# Patient Record
Sex: Female | Born: 1955 | Race: White | Hispanic: No | Marital: Married | Smoking: Never smoker
Health system: Southern US, Community
[De-identification: ages and names within clinical notes are randomized; demographics above are authoritative.]

## PROBLEM LIST (undated history)

## (undated) DIAGNOSIS — I2510 Atherosclerotic heart disease of native coronary artery without angina pectoris: Secondary | ICD-10-CM

## (undated) DIAGNOSIS — R002 Palpitations: Secondary | ICD-10-CM

## (undated) DIAGNOSIS — R7303 Prediabetes: Secondary | ICD-10-CM

## (undated) DIAGNOSIS — J45909 Unspecified asthma, uncomplicated: Secondary | ICD-10-CM

## (undated) DIAGNOSIS — G43909 Migraine, unspecified, not intractable, without status migrainosus: Secondary | ICD-10-CM

## (undated) DIAGNOSIS — E785 Hyperlipidemia, unspecified: Secondary | ICD-10-CM

## (undated) DIAGNOSIS — E039 Hypothyroidism, unspecified: Secondary | ICD-10-CM

## (undated) DIAGNOSIS — E781 Pure hyperglyceridemia: Secondary | ICD-10-CM

## (undated) DIAGNOSIS — T7840XA Allergy, unspecified, initial encounter: Secondary | ICD-10-CM

## (undated) DIAGNOSIS — I10 Essential (primary) hypertension: Secondary | ICD-10-CM

## (undated) HISTORY — DX: Essential (primary) hypertension: I10

## (undated) HISTORY — DX: Palpitations: R00.2

## (undated) HISTORY — PX: OVARIAN CYST SURGERY: SHX726

## (undated) HISTORY — DX: Prediabetes: R73.03

## (undated) HISTORY — PX: ANKLE SURGERY: SHX546

## (undated) HISTORY — PX: OTHER SURGICAL HISTORY: SHX169

## (undated) HISTORY — DX: Migraine, unspecified, not intractable, without status migrainosus: G43.909

## (undated) HISTORY — DX: Hyperlipidemia, unspecified: E78.5

## (undated) HISTORY — DX: Unspecified asthma, uncomplicated: J45.909

## (undated) HISTORY — PX: TONSILLECTOMY AND ADENOIDECTOMY: SUR1326

## (undated) HISTORY — DX: Pure hyperglyceridemia: E78.1

## (undated) HISTORY — DX: Hypothyroidism, unspecified: E03.9

## (undated) HISTORY — DX: Allergy, unspecified, initial encounter: T78.40XA

## (undated) HISTORY — DX: Atherosclerotic heart disease of native coronary artery without angina pectoris: I25.10

---

## 1984-06-02 HISTORY — PX: ABDOMINAL HYSTERECTOMY: SHX81

## 1999-07-12 ENCOUNTER — Encounter: Payer: Self-pay | Admitting: Internal Medicine

## 1999-07-12 ENCOUNTER — Ambulatory Visit (HOSPITAL_COMMUNITY): Admission: RE | Admit: 1999-07-12 | Discharge: 1999-07-12 | Payer: Self-pay | Admitting: Internal Medicine

## 2000-09-08 ENCOUNTER — Encounter: Payer: Self-pay | Admitting: Internal Medicine

## 2000-09-08 ENCOUNTER — Encounter: Admission: RE | Admit: 2000-09-08 | Discharge: 2000-09-08 | Payer: Self-pay | Admitting: Internal Medicine

## 2000-09-14 ENCOUNTER — Encounter: Payer: Self-pay | Admitting: Internal Medicine

## 2000-09-14 ENCOUNTER — Encounter: Admission: RE | Admit: 2000-09-14 | Discharge: 2000-09-14 | Payer: Self-pay

## 2001-01-05 ENCOUNTER — Encounter: Admission: RE | Admit: 2001-01-05 | Discharge: 2001-01-05 | Payer: Self-pay | Admitting: Urology

## 2001-01-05 ENCOUNTER — Encounter: Payer: Self-pay | Admitting: Urology

## 2001-04-12 ENCOUNTER — Encounter: Payer: Self-pay | Admitting: Internal Medicine

## 2001-04-12 ENCOUNTER — Encounter: Admission: RE | Admit: 2001-04-12 | Discharge: 2001-04-12 | Payer: Self-pay | Admitting: Internal Medicine

## 2001-05-06 ENCOUNTER — Emergency Department (HOSPITAL_COMMUNITY): Admission: EM | Admit: 2001-05-06 | Discharge: 2001-05-06 | Payer: Self-pay | Admitting: Emergency Medicine

## 2001-05-06 ENCOUNTER — Encounter: Payer: Self-pay | Admitting: Emergency Medicine

## 2001-11-18 ENCOUNTER — Encounter: Admission: RE | Admit: 2001-11-18 | Discharge: 2001-11-18 | Payer: Self-pay | Admitting: Internal Medicine

## 2001-11-18 ENCOUNTER — Encounter: Payer: Self-pay | Admitting: Internal Medicine

## 2002-05-05 ENCOUNTER — Ambulatory Visit (HOSPITAL_COMMUNITY): Admission: RE | Admit: 2002-05-05 | Discharge: 2002-05-05 | Payer: Self-pay | Admitting: Gastroenterology

## 2002-11-21 ENCOUNTER — Encounter: Payer: Self-pay | Admitting: Internal Medicine

## 2002-11-21 ENCOUNTER — Encounter: Admission: RE | Admit: 2002-11-21 | Discharge: 2002-11-21 | Payer: Self-pay | Admitting: Internal Medicine

## 2003-12-07 ENCOUNTER — Encounter: Admission: RE | Admit: 2003-12-07 | Discharge: 2003-12-07 | Payer: Self-pay | Admitting: Internal Medicine

## 2004-06-02 LAB — HM COLONOSCOPY

## 2004-12-19 ENCOUNTER — Encounter: Admission: RE | Admit: 2004-12-19 | Discharge: 2004-12-19 | Payer: Self-pay | Admitting: Internal Medicine

## 2004-12-30 ENCOUNTER — Encounter: Admission: RE | Admit: 2004-12-30 | Discharge: 2004-12-30 | Payer: Self-pay | Admitting: Internal Medicine

## 2005-12-22 ENCOUNTER — Encounter: Admission: RE | Admit: 2005-12-22 | Discharge: 2005-12-22 | Payer: Self-pay | Admitting: Internal Medicine

## 2006-06-02 HISTORY — PX: BACK SURGERY: SHX140

## 2006-09-03 ENCOUNTER — Encounter: Admission: RE | Admit: 2006-09-03 | Discharge: 2006-09-03 | Payer: Self-pay | Admitting: Family Medicine

## 2006-11-30 ENCOUNTER — Ambulatory Visit (HOSPITAL_COMMUNITY): Admission: RE | Admit: 2006-11-30 | Discharge: 2006-12-01 | Payer: Self-pay | Admitting: Neurosurgery

## 2006-12-21 ENCOUNTER — Other Ambulatory Visit: Admission: RE | Admit: 2006-12-21 | Discharge: 2006-12-21 | Payer: Self-pay | Admitting: *Deleted

## 2007-01-04 ENCOUNTER — Encounter: Admission: RE | Admit: 2007-01-04 | Discharge: 2007-01-04 | Payer: Self-pay | Admitting: *Deleted

## 2007-05-17 ENCOUNTER — Encounter: Admission: RE | Admit: 2007-05-17 | Discharge: 2007-05-17 | Payer: Self-pay | Admitting: Neurosurgery

## 2007-08-01 ENCOUNTER — Emergency Department (HOSPITAL_COMMUNITY): Admission: EM | Admit: 2007-08-01 | Discharge: 2007-08-01 | Payer: Self-pay | Admitting: Emergency Medicine

## 2007-08-11 ENCOUNTER — Encounter: Admission: RE | Admit: 2007-08-11 | Discharge: 2007-08-11 | Payer: Self-pay | Admitting: Family Medicine

## 2008-01-05 ENCOUNTER — Encounter: Admission: RE | Admit: 2008-01-05 | Discharge: 2008-01-05 | Payer: Self-pay | Admitting: *Deleted

## 2008-01-13 ENCOUNTER — Encounter: Admission: RE | Admit: 2008-01-13 | Discharge: 2008-01-13 | Payer: Self-pay | Admitting: *Deleted

## 2008-04-05 IMAGING — CR DG CHEST 2V
2 series · 2 of 2 positions shown · non-contrast
Comparison: none

CLINICAL DATA: HNP.  Asthma.  Hypertension.  Preadmission workup.
 CHEST- 2 VIEWS:
 No priors for comparison.

[view not recorded (1 of 2)]
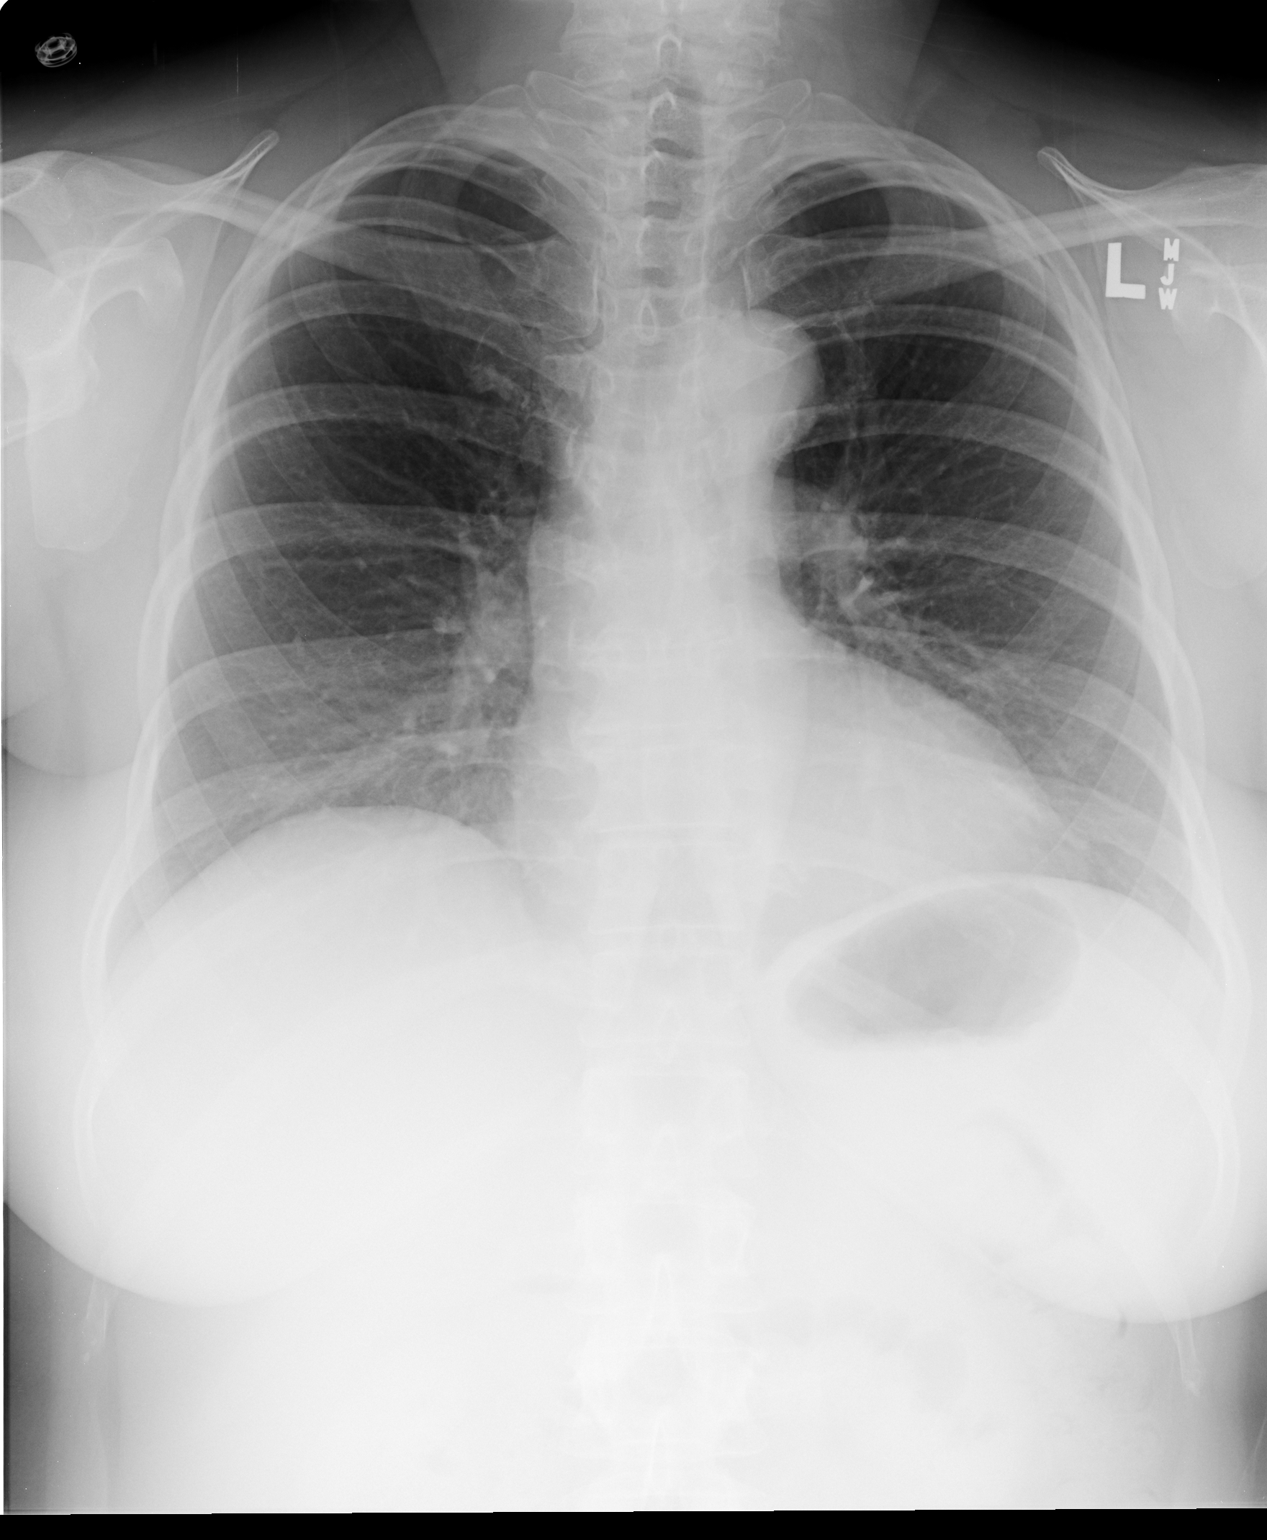

[view not recorded (2 of 2)]
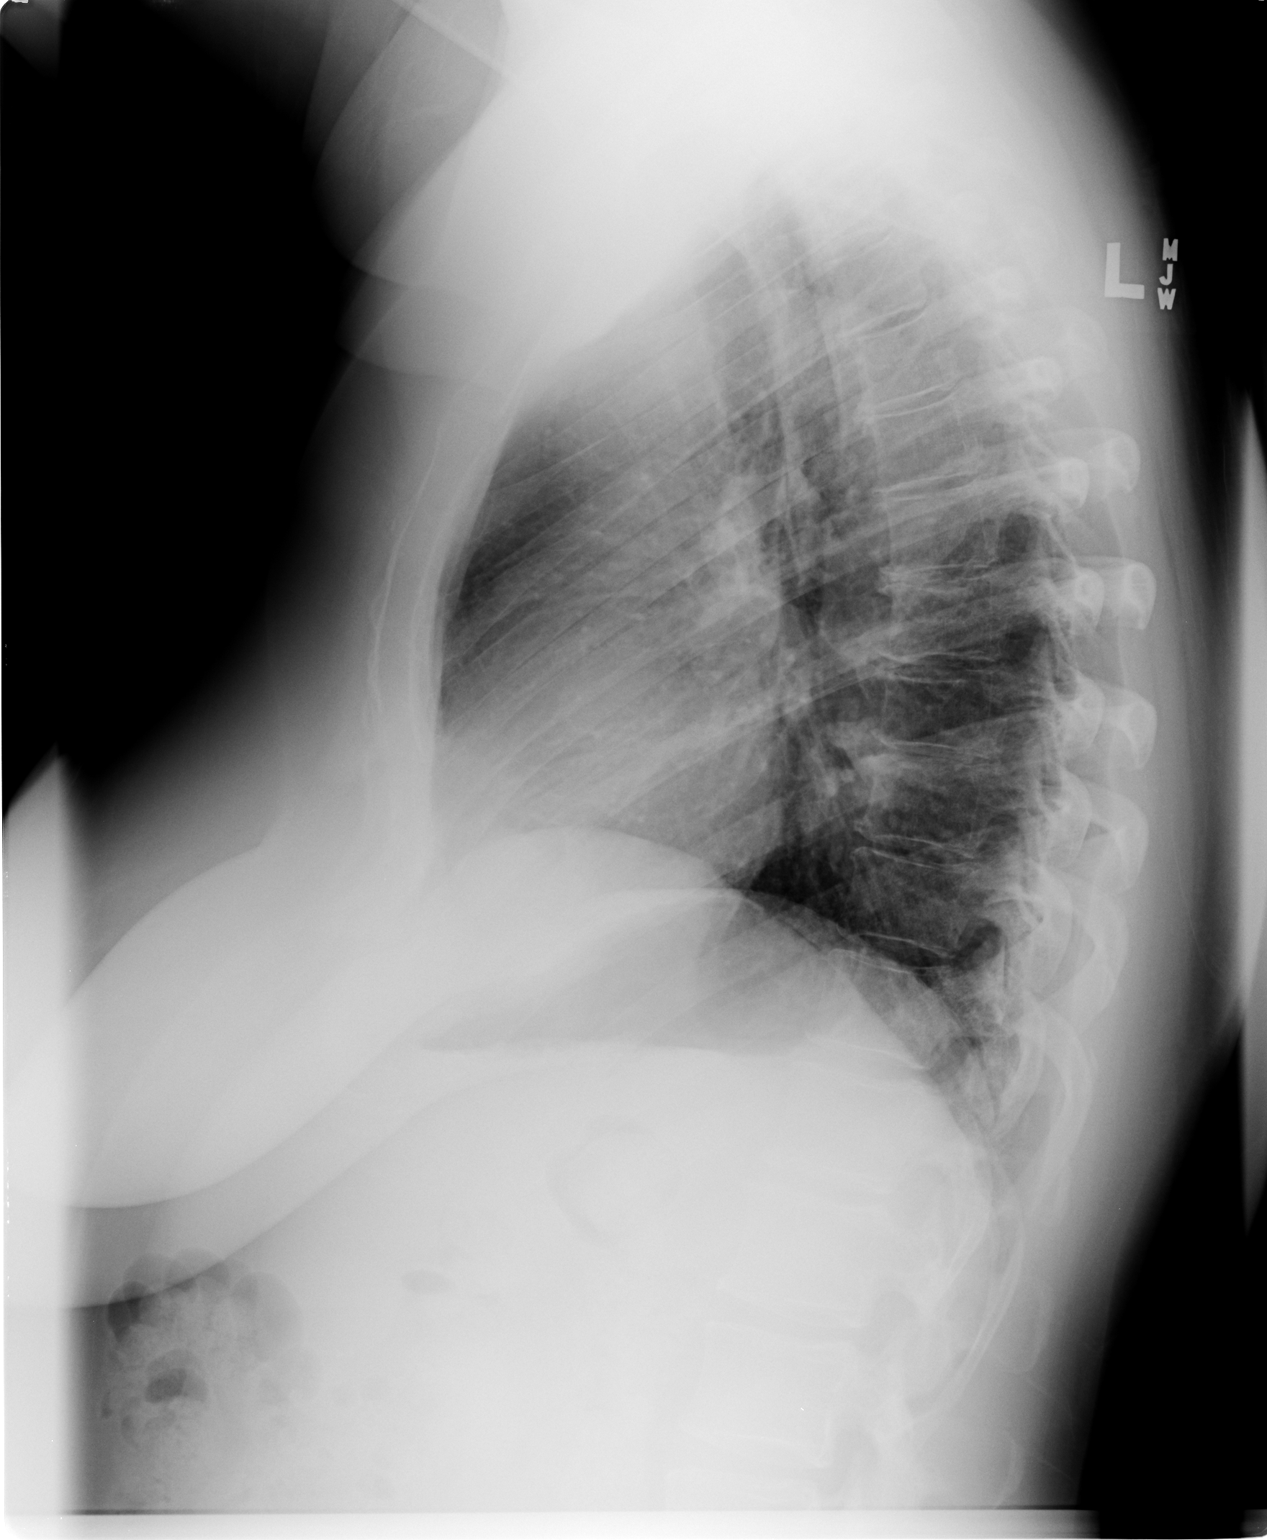

[2 of 2 positions shown; findings below may reference images not displayed]

FINDINGS: The heart size and mediastinal contours are within normal limits.  Both lungs are clear.  The visualized skeletal structures are within normal limits.
IMPRESSION: No active cardiopulmonary disease.

## 2008-07-25 ENCOUNTER — Encounter: Admission: RE | Admit: 2008-07-25 | Discharge: 2008-07-25 | Payer: Self-pay

## 2009-01-19 ENCOUNTER — Encounter: Admission: RE | Admit: 2009-01-19 | Discharge: 2009-01-19 | Payer: Self-pay | Admitting: Family Medicine

## 2009-06-02 HISTORY — PX: CARPAL TUNNEL RELEASE: SHX101

## 2010-02-20 ENCOUNTER — Encounter: Admission: RE | Admit: 2010-02-20 | Discharge: 2010-02-20 | Payer: Self-pay | Admitting: *Deleted

## 2010-10-15 NOTE — Op Note (Signed)
Tamara Rosales, BENCOSME              ACCOUNT NO.:  [id number]   MEDICAL RECORD NO.:  [id number]          PATIENT TYPE:  OIB   LOCATION:  3029                         FACILITY:  [facility]   PHYSICIAN:  Kathaleen Maser. Pool, M.D.    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   PROCEDURE NOTE:  Left L4-5 laminotomy and microdiskectomy.   SURGEON:  Kathaleen Maser. Pool, M.D.   ASSISTANT:  Reinaldo Meeker, M.D.   ANESTHESIA:  General endotracheal.   PREMEDICATION:  Ms. 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  Workup  demonstrates evidence of a left-sided L4-5 disk herniation with  compression left-sided L5 nerve root as well as superior free fragment  migrated into the axilla of the left-sided L4 nerve roots.  We discussed  risks benefits involved with surgery.  The patient wishes to proceed.   OPERATIVE NOTE:  The patient was taken to the operating room, placed on  operating table patient in supine position, adequate level anesthesia  was achieved. The patient positioned prone onto Wilson frame,  appropriately padded.  Patient's lumbar region prepped and draped  sterilely.  10 blade used to make a linear skin incision overlying the  L4-5 interspace.  This carried down sharply in the midline.  Subperiosteal dissection then performed exposing lamina and facet joints  at L4 and L5 on the left side.  Deep self-retaining retractor was  placed.  Intraoperative x-ray was taken and the level was confirmed.  The laminotomy was then performed using high speed drill and Kerrison  rongeurs to remove inferior aspect of lamina of L4, medial  aspect of L4-  5 facet joint and superior rim of the L5 lamina.  Ligamentum flavum was  elevated and resected in piecemeal fashion using Kerrison rongeurs.  The  underlying thecal sac was identified.  Microscope then brought into the  field and used for microdissection of the left-sided L4 and L5 nerve  roots.  Epidural venous plexus was coagulated and cut.  Thecal sac and  L5 nerve root were mobilized, retracted toward the midline.  Disk  herniation was readily apparent.  This was then incised with a 15 blade  in a rectangular fashion.  A wide disk space clean out was then achieved  using pituitary rongeurs, upward angled pituitary rongeurs, and Epstein  curettes.  Attention was then placed to the axilla of the left-sided L4  nerve root where a large amount of free fragment of disk herniation was  encountered and completely resected.  At this point a very thorough  decompression of both the L4 and L5 nerve roots had been achieved.  There was no evidence of injury to thecal sac or nerve roots.  There is  no evidence of loose or obviously degenerative  disk material remaining  within the interspace.  The wound was then irrigated with antibiotic  solution.  Gelfoam was placed topically for hemostasis which was found  to be good.  The microscope and retractor system were removed.  Hemostasis of muscle achieved with electrocautery.  Wound was then  closed in layers with Vicryl suture. Steri-Strips and sterile dressing  were applied.  There were no apparent complications.  The patient  tolerated the procedure well and she returned to recovery room  postoperatively.           ______________________________  Kathaleen Maser Pool, M.D.     HAP/MEDQ  D:  11/30/2006  T:  12/01/2006  Job:  [id number]

## 2010-10-18 NOTE — Op Note (Signed)
   NAME:  Tamara Rosales, Tamara Rosales                        ACCOUNT NO.:  [id number]   MEDICAL RECORD NO.:  [id number]                   PATIENT TYPE:  AMB   LOCATION:  ENDO                                 FACILITY:  [facility]   PHYSICIAN:  Danise Edge, M.D.                DATE OF BIRTH:  December 23, 1955   DATE OF PROCEDURE:  05/05/2002  DATE OF DISCHARGE:                                 OPERATIVE REPORT   PROCEDURE PERFORMED:  Colonoscopy.   REFERRING PHYSICIAN:  Marcene Duos, M.D.   ENDOSCOPIST:  Charolett Bumpers, M.D.   INDICATIONS FOR PROCEDURE:  The patient is a 55 year old female born [birth year].  The patient is undergoing diagnostic colonoscopy to evaluate  guaiac positive stool.   PREMEDICATION:  Fentanyl 75 mcg, Versed 10 mg.   INSTRUMENT USED:  Pediatric Olympus video colonoscope.   DESCRIPTION OF PROCEDURE:  After obtaining informed consent, the patient was  placed in the left lateral decubitus position.  I administered intravenous  fentanyl and intravenous Versed to achieve conscious sedation for the  procedure.  The patient's blood pressure, oxygen saturations and cardiac  rhythm were monitored throughout the procedure and documented in the medical  record.   Anal inspection was normal.  Digital rectal exam was normal.  The pediatric  Olympus video colonoscope was introduced into the rectum and advanced to the  cecum.  A normal-appearing ileocecal valve was intubated and the distal  ileum inspected.  Colonic preparation for the exam today was excellent.   Rectum:  Normal.   Sigmoid colon and descending colon:  Normal.   Splenic flexure:  Normal.   Transverse colon:  Normal.   Hepatic flexure:  Normal.   Ascending colon:  Normal.   Cecum and ileocecal valve:  Normal.   Distal ileum:  Normal.    ASSESSMENT:  Normal proctocolonoscopy to the cecum.  No endoscopic evidence  for the presence of lower gastrointestinal bleeding, colorectal neoplasia or  inflammatory bowel disease.                                                    Danise Edge, M.D.    MJ/MEDQ  D:  05/05/2002  T:  05/05/2002  Job:  [id number]

## 2011-01-20 ENCOUNTER — Other Ambulatory Visit: Payer: Self-pay | Admitting: *Deleted

## 2011-01-20 DIAGNOSIS — Z1231 Encounter for screening mammogram for malignant neoplasm of breast: Secondary | ICD-10-CM

## 2011-02-24 ENCOUNTER — Ambulatory Visit
Admission: RE | Admit: 2011-02-24 | Discharge: 2011-02-24 | Disposition: A | Discharge status: Home / Self Care | Payer: BC Managed Care – PPO | Source: Ambulatory Visit | Attending: *Deleted | Admitting: *Deleted

## 2011-02-24 ENCOUNTER — Other Ambulatory Visit: Payer: Self-pay | Admitting: Family Medicine

## 2011-02-24 DIAGNOSIS — Z1231 Encounter for screening mammogram for malignant neoplasm of breast: Secondary | ICD-10-CM

## 2011-02-24 LAB — COMPREHENSIVE METABOLIC PANEL
ALT: 30
AST: 22
Albumin: 3.6
Alkaline Phosphatase: 43
BUN: 11
CO2: 28
Calcium: 9
Chloride: 100
Creatinine, Ser: 0.78
GFR calc Af Amer: >60
GFR calc non Af Amer: >60
Glucose, Bld: 141 — ABNORMAL HIGH
Potassium: 3 — ABNORMAL LOW
Sodium: 139
Total Bilirubin: 0.7
Total Protein: 7.1

## 2011-02-24 LAB — CBC
HCT: 39.5
Hemoglobin: 13.9
MCHC: 35.2
MCV: 95.8
Platelets: 229
RBC: 4.13
RDW: 12.1
WBC: 13.5 — ABNORMAL HIGH

## 2011-02-24 LAB — DIFFERENTIAL
Basophils Absolute: 0
Basophils Relative: 0
Eosinophils Absolute: 0
Eosinophils Relative: 0
Lymphocytes Relative: 7 — ABNORMAL LOW
Lymphs Abs: 1
Monocytes Absolute: 1
Monocytes Relative: 8
Neutro Abs: 11.4 — ABNORMAL HIGH
Neutrophils Relative %: 85 — ABNORMAL HIGH

## 2011-02-24 LAB — URINALYSIS, ROUTINE W REFLEX MICROSCOPIC
Bilirubin Urine: NEGATIVE
Glucose, UA: NEGATIVE
Hgb urine dipstick: NEGATIVE
Ketones, ur: NEGATIVE
Nitrite: NEGATIVE
Protein, ur: NEGATIVE
Specific Gravity, Urine: 1.018
Urobilinogen, UA: 1
pH: 7

## 2011-02-24 LAB — INFLUENZA A+B VIRUS AG-DIRECT(RAPID)
Inflenza A Ag: NEGATIVE
Influenza B Ag: NEGATIVE

## 2011-03-19 LAB — BASIC METABOLIC PANEL
BUN: 17
CO2: 31
Calcium: 9.4
Chloride: 101
Creatinine, Ser: 0.63
GFR calc Af Amer: >60
GFR calc non Af Amer: >60
Glucose, Bld: 99
Potassium: 3.6
Sodium: 137

## 2011-03-19 LAB — DIFFERENTIAL
Basophils Absolute: 0
Basophils Relative: 1
Eosinophils Absolute: 0.1
Eosinophils Relative: 3
Lymphocytes Relative: 51 — ABNORMAL HIGH
Lymphs Abs: 2.3
Monocytes Absolute: 0.5
Monocytes Relative: 11
Neutro Abs: 1.5 — ABNORMAL LOW
Neutrophils Relative %: 34 — ABNORMAL LOW

## 2011-03-19 LAB — CBC
HCT: 39.5
Hemoglobin: 14.1
MCHC: 35.7
MCV: 93.5
Platelets: 283
RBC: 4.23
RDW: 12.6
WBC: 4.5

## 2011-03-19 LAB — TYPE AND SCREEN
ABO/RH(D): O POS
Antibody Screen: NEGATIVE

## 2011-03-19 LAB — ABO/RH: ABO/RH(D): O POS

## 2012-03-09 ENCOUNTER — Other Ambulatory Visit: Payer: Self-pay | Admitting: Family Medicine

## 2012-03-09 DIAGNOSIS — Z1231 Encounter for screening mammogram for malignant neoplasm of breast: Secondary | ICD-10-CM

## 2012-04-06 ENCOUNTER — Ambulatory Visit
Admission: RE | Admit: 2012-04-06 | Discharge: 2012-04-06 | Disposition: A | Discharge status: Home / Self Care | Payer: BC Managed Care – PPO | Source: Ambulatory Visit | Attending: Family Medicine | Admitting: Family Medicine

## 2012-04-06 DIAGNOSIS — Z1231 Encounter for screening mammogram for malignant neoplasm of breast: Secondary | ICD-10-CM

## 2012-04-06 LAB — HM MAMMOGRAPHY: HM Mammogram: NEGATIVE

## 2012-09-09 ENCOUNTER — Encounter: Payer: Self-pay | Admitting: *Deleted

## 2012-09-15 ENCOUNTER — Encounter: Payer: Self-pay | Admitting: *Deleted

## 2012-09-15 ENCOUNTER — Other Ambulatory Visit: Payer: Self-pay | Admitting: Neurosurgery

## 2012-09-15 DIAGNOSIS — M792 Neuralgia and neuritis, unspecified: Secondary | ICD-10-CM

## 2012-09-24 ENCOUNTER — Other Ambulatory Visit: Payer: BC Managed Care – PPO

## 2012-09-27 ENCOUNTER — Ambulatory Visit
Admission: RE | Admit: 2012-09-27 | Discharge: 2012-09-27 | Disposition: A | Discharge status: Home / Self Care | Payer: BC Managed Care – PPO | Source: Ambulatory Visit | Attending: Neurosurgery | Admitting: Neurosurgery

## 2012-09-27 DIAGNOSIS — M792 Neuralgia and neuritis, unspecified: Secondary | ICD-10-CM

## 2012-09-27 MED ORDER — GADOBENATE DIMEGLUMINE 529 MG/ML IV SOLN
17.0000 mL | Freq: Once | INTRAVENOUS | Status: AC | PRN
  Administered 2012-09-27: 17 mL via INTRAVENOUS

## 2012-10-06 ENCOUNTER — Ambulatory Visit (INDEPENDENT_AMBULATORY_CARE_PROVIDER_SITE_OTHER): Payer: BC Managed Care – PPO | Admitting: Family Medicine

## 2012-10-06 ENCOUNTER — Encounter: Payer: Self-pay | Admitting: Family Medicine

## 2012-10-06 VITALS — BP 126/90 | HR 76 | Ht 64.0 in | Wt 203.0 lb

## 2012-10-06 DIAGNOSIS — R5383 Other fatigue: Secondary | ICD-10-CM

## 2012-10-06 DIAGNOSIS — I10 Essential (primary) hypertension: Secondary | ICD-10-CM | POA: Insufficient documentation

## 2012-10-06 DIAGNOSIS — J45901 Unspecified asthma with (acute) exacerbation: Secondary | ICD-10-CM

## 2012-10-06 DIAGNOSIS — E039 Hypothyroidism, unspecified: Secondary | ICD-10-CM

## 2012-10-06 DIAGNOSIS — J309 Allergic rhinitis, unspecified: Secondary | ICD-10-CM

## 2012-10-06 DIAGNOSIS — R5381 Other malaise: Secondary | ICD-10-CM

## 2012-10-06 DIAGNOSIS — Z0000 Encounter for general adult medical examination without abnormal findings: Secondary | ICD-10-CM

## 2012-10-06 DIAGNOSIS — N959 Unspecified menopausal and perimenopausal disorder: Secondary | ICD-10-CM | POA: Insufficient documentation

## 2012-10-06 LAB — CBC WITH DIFFERENTIAL/PLATELET
Basophils Absolute: 0 10*3/uL (ref 0.0–0.1)
Basophils Relative: 0 % (ref 0–1)
Eosinophils Absolute: 0.2 10*3/uL (ref 0.0–0.7)
Eosinophils Relative: 2 % (ref 0–5)
HCT: 37.8 % (ref 36.0–46.0)
Hemoglobin: 13.2 g/dL (ref 12.0–15.0)
Lymphocytes Relative: 43 % (ref 12–46)
Lymphs Abs: 3.1 10*3/uL (ref 0.7–4.0)
MCH: 32.5 pg (ref 26.0–34.0)
MCHC: 34.9 g/dL (ref 30.0–36.0)
MCV: 93.1 fL (ref 78.0–100.0)
Monocytes Absolute: 0.6 10*3/uL (ref 0.1–1.0)
Monocytes Relative: 8 % (ref 3–12)
Neutro Abs: 3.3 10*3/uL (ref 1.7–7.7)
Neutrophils Relative %: 47 % (ref 43–77)
Platelets: 267 10*3/uL (ref 150–400)
RBC: 4.06 MIL/uL (ref 3.87–5.11)
RDW: 13.5 % (ref 11.5–15.5)
WBC: 7.2 10*3/uL (ref 4.0–10.5)

## 2012-10-06 LAB — POCT URINALYSIS DIPSTICK
Bilirubin, UA: NEGATIVE
Blood, UA: NEGATIVE
Glucose, UA: NEGATIVE
Ketones, UA: NEGATIVE
Leukocytes, UA: NEGATIVE
Nitrite, UA: NEGATIVE
Protein, UA: NEGATIVE
Spec Grav, UA: <=1.005
Urobilinogen, UA: NEGATIVE
pH, UA: 5

## 2012-10-06 LAB — COMPREHENSIVE METABOLIC PANEL
ALT: 27 U/L (ref 0–35)
AST: 23 U/L (ref 0–37)
Albumin: 4.3 g/dL (ref 3.5–5.2)
Alkaline Phosphatase: 39 U/L (ref 39–117)
BUN: 16 mg/dL (ref 6–23)
CO2: 31 mEq/L (ref 19–32)
Calcium: 9.7 mg/dL (ref 8.4–10.5)
Chloride: 101 mEq/L (ref 96–112)
Creat: 0.71 mg/dL (ref 0.50–1.10)
Glucose, Bld: 80 mg/dL (ref 70–99)
Potassium: 3.5 mEq/L (ref 3.5–5.3)
Sodium: 141 mEq/L (ref 135–145)
Total Bilirubin: 0.5 mg/dL (ref 0.3–1.2)
Total Protein: 7.1 g/dL (ref 6.0–8.3)

## 2012-10-06 LAB — LIPID PANEL
Cholesterol: 168 mg/dL (ref 0–200)
HDL: 45 mg/dL (ref >39)
LDL Cholesterol: 89 mg/dL (ref 0–99)
Total CHOL/HDL Ratio: 3.7 Ratio
Triglycerides: 171 mg/dL — ABNORMAL HIGH (ref <150)
VLDL: 34 mg/dL (ref 0–40)

## 2012-10-06 LAB — TSH: TSH: 1.154 u[IU]/mL (ref 0.350–4.500)

## 2012-10-06 MED ORDER — ESTROGENS, CONJUGATED 0.625 MG/GM VA CREA: TOPICAL_CREAM | VAGINAL | Status: DC

## 2012-10-06 MED ORDER — LISINOPRIL-HYDROCHLOROTHIAZIDE 20-12.5 MG PO TABS: 2.0000 | ORAL_TABLET | Freq: Every day | ORAL | Status: DC
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
## 2012-10-06 NOTE — Patient Instructions (Addendum)

## 2012-10-07 ENCOUNTER — Encounter: Payer: Self-pay | Admitting: Family Medicine

## 2012-10-07 LAB — VITAMIN D 25 HYDROXY (VIT D DEFICIENCY, FRACTURES): Vit D, 25-Hydroxy: 45 ng/mL (ref 30–89)

## 2012-10-07 MED ORDER — SYNTHROID 125 MCG PO TABS: 125.0000 ug | ORAL_TABLET | Freq: Every day | ORAL | Status: DC

## 2012-10-07 NOTE — Addendum Note (Signed)
Addended by: Joselyn Arrow on: 10/07/2012 08:40 AM   Modules accepted: Orders

## 2013-03-04 ENCOUNTER — Other Ambulatory Visit (INDEPENDENT_AMBULATORY_CARE_PROVIDER_SITE_OTHER): Payer: BC Managed Care – PPO

## 2013-03-04 DIAGNOSIS — Z23 Encounter for immunization: Secondary | ICD-10-CM

## 2013-03-10 ENCOUNTER — Other Ambulatory Visit: Payer: Self-pay

## 2013-03-10 DIAGNOSIS — Z1231 Encounter for screening mammogram for malignant neoplasm of breast: Secondary | ICD-10-CM

## 2013-04-05 ENCOUNTER — Other Ambulatory Visit: Payer: Self-pay | Admitting: Family Medicine

## 2013-04-27 ENCOUNTER — Ambulatory Visit
Admission: RE | Admit: 2013-04-27 | Discharge: 2013-04-27 | Disposition: A | Discharge status: Home / Self Care | Payer: BC Managed Care – PPO | Source: Ambulatory Visit

## 2013-04-27 ENCOUNTER — Encounter: Payer: BC Managed Care – PPO | Admitting: Family Medicine

## 2013-04-27 DIAGNOSIS — Z1231 Encounter for screening mammogram for malignant neoplasm of breast: Secondary | ICD-10-CM

## 2013-06-20 ENCOUNTER — Encounter: Payer: BC Managed Care – PPO | Admitting: Family Medicine

## 2013-07-13 ENCOUNTER — Encounter: Payer: Self-pay | Admitting: Family Medicine

## 2013-07-13 ENCOUNTER — Ambulatory Visit (INDEPENDENT_AMBULATORY_CARE_PROVIDER_SITE_OTHER): Payer: BC Managed Care – PPO | Admitting: Family Medicine

## 2013-07-13 VITALS — BP 120/78 | HR 76 | Ht 64.0 in | Wt 192.0 lb

## 2013-07-13 DIAGNOSIS — E039 Hypothyroidism, unspecified: Secondary | ICD-10-CM

## 2013-07-13 DIAGNOSIS — J45909 Unspecified asthma, uncomplicated: Secondary | ICD-10-CM

## 2013-07-13 DIAGNOSIS — R5383 Other fatigue: Secondary | ICD-10-CM

## 2013-07-13 DIAGNOSIS — I10 Essential (primary) hypertension: Secondary | ICD-10-CM

## 2013-07-13 DIAGNOSIS — J309 Allergic rhinitis, unspecified: Secondary | ICD-10-CM

## 2013-07-13 DIAGNOSIS — J45998 Other asthma: Secondary | ICD-10-CM

## 2013-07-13 DIAGNOSIS — R5381 Other malaise: Secondary | ICD-10-CM

## 2013-07-13 MED ORDER — LISINOPRIL-HYDROCHLOROTHIAZIDE 20-12.5 MG PO TABS: ORAL_TABLET | ORAL | Status: DC

## 2013-07-13 NOTE — Patient Instructions (Signed)
Continue your current medications. Try using exercise for stress reduction. Continue Weight Watchers and weight loss.  We will contact you with your results, and send in Synthroid refill after your labs are reviewed.
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
## 2013-07-14 LAB — TSH: TSH: 1.575 u[IU]/mL (ref 0.350–4.500)

## 2013-07-14 MED ORDER — SYNTHROID 125 MCG PO TABS: ORAL_TABLET | ORAL | Status: DC

## 2013-11-04 ENCOUNTER — Telehealth: Payer: Self-pay

## 2013-11-04 NOTE — Telephone Encounter (Signed)
Pt states she has never tried anything in the past and that she will not be on a boat or any water she will be traveling by bus and would need something for on the way there and on the way back. She states she would like to discuss this with you further on Monday.

## 2013-11-04 NOTE — Telephone Encounter (Signed)
Pt states she is going on a mission trip on 11[place]27/13 and would like to know if you can call her something in to prevent motion sickness

## 2013-11-04 NOTE — Telephone Encounter (Signed)
Has she tried anything in the past? Usually OTC Dramamine is effective for motion sickness.  There are prescription patches that are usually used for more severe, longterm cases (ie cruises).   Side effects of both meds include dry mouth, sedation, constipation.  The patch is changed every 3 days--if she only needs for travel days, doesn't necessarily make sense to use the patch. If she will be on a boat, frequent buses with daily activities that might make her sick, then the patch might be okay.  I need to know how long she will be gone to know how many patches to prescribe, if she decides to go the rx route rather than dramamine (there are "less drowsy" forms of dramamine, which is actually meclizine, also used for vertigo, that can also be very effective, although still slightly sedating). If she has more questions, we can call her back Monday when I'm in the office (vs offering OV to discuss options)

## 2013-11-04 NOTE — Telephone Encounter (Signed)
It doesn't appear that she scheduled OV (which would be recommended when there are openings and she is asking for a new prescription medication).  Please call her Monday morning, and recommend that she try the OTC Meclizine--she can try it out now and see how she feels with it.  Vs scheduling appt to discuss prescription options

## 2013-11-07 NOTE — Telephone Encounter (Signed)
Called patient and left message letting her know that Dr.Knapp is recommending that she try OTC Meclizine and she how she feels with it. If she would rather come in for OV to discuss the rx options that is okay to-just needs to call me back to schedule something.

## 2013-11-16 ENCOUNTER — Encounter: Payer: BC Managed Care – PPO | Admitting: Family Medicine

## 2013-12-27 ENCOUNTER — Telehealth: Payer: Self-pay | Admitting: Family Medicine

## 2013-12-27 NOTE — Telephone Encounter (Signed)
L/m for pt to call. Needs to reschedule cpe on 08/17

## 2014-01-03 ENCOUNTER — Encounter: Payer: Self-pay | Admitting: Family Medicine

## 2014-01-06 ENCOUNTER — Telehealth: Payer: Self-pay | Admitting: Family Medicine

## 2014-01-06 NOTE — Telephone Encounter (Signed)
Patient received message to reschedule 8/17 CPE, next availble CPE is 03/30/14. Patient has forms for DMV that she has to have completed and returned to [facility] for her knee brace and can not wait that long. Is there any way we can leave her on 01/16/14 and give her extra time or something

## 2014-01-06 NOTE — Telephone Encounter (Signed)
She is due for med check (labs were 09/2012)--I would prefer that visit be changed to a med check (fasting), and r/s the CPE--that is assuming she isn't on Medicare.  I can't see what her insurance is, but she is only 59. Her last OV was 07/2013 and she will be needing refills on BP and thyroid meds, so a med check would be appropriate (and can be done on 8/17). I can look at her DMV forms at that visit--I'm not aware of her having a knee brace or filling out DMV forms in the past for her (nothing in media, no mention in her first and only visit here last year).  She can r/s the CPE (no particular rush, so ok for [employer] would end up being charged for OV as well as CPE if being done in August,whereas if she has no other issues in October, will only be CPE then).  If this turns out to be a big issue (it shouldn't be), then okay to keep as is, but need to limit other med checks that morning.

## 2014-01-16 ENCOUNTER — Ambulatory Visit (INDEPENDENT_AMBULATORY_CARE_PROVIDER_SITE_OTHER): Payer: BC Managed Care – PPO | Admitting: Family Medicine

## 2014-01-16 ENCOUNTER — Encounter: Payer: Self-pay | Admitting: Family Medicine

## 2014-01-16 VITALS — BP 126/80 | HR 68 | Ht 64.0 in | Wt 201.0 lb

## 2014-01-16 DIAGNOSIS — I10 Essential (primary) hypertension: Secondary | ICD-10-CM

## 2014-01-16 DIAGNOSIS — E039 Hypothyroidism, unspecified: Secondary | ICD-10-CM

## 2014-01-16 DIAGNOSIS — J45901 Unspecified asthma with (acute) exacerbation: Secondary | ICD-10-CM

## 2014-01-16 DIAGNOSIS — E781 Pure hyperglyceridemia: Secondary | ICD-10-CM

## 2014-01-16 MED ORDER — LISINOPRIL-HYDROCHLOROTHIAZIDE 20-12.5 MG PO TABS: ORAL_TABLET | ORAL | Status: DC

## 2014-01-16 MED ORDER — ALBUTEROL SULFATE 0.63 MG/3ML IN NEBU: 1.0000 | INHALATION_SOLUTION | Freq: Four times a day (QID) | RESPIRATORY_TRACT | Status: DC | PRN

## 2014-01-16 MED ORDER — ALBUTEROL SULFATE HFA 108 (90 BASE) MCG/ACT IN AERS: 2.0000 | INHALATION_SPRAY | Freq: Four times a day (QID) | RESPIRATORY_TRACT | Status: DC | PRN

## 2014-01-16 NOTE — Patient Instructions (Signed)
Restart your walking routine, and try and lose weight. Return for fasting labs as soon as you can.
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
## 2014-03-28 ENCOUNTER — Encounter: Payer: Self-pay | Admitting: Internal Medicine

## 2014-03-29 ENCOUNTER — Other Ambulatory Visit: Payer: Self-pay

## 2014-03-29 DIAGNOSIS — Z1231 Encounter for screening mammogram for malignant neoplasm of breast: Secondary | ICD-10-CM

## 2014-03-30 ENCOUNTER — Encounter: Payer: BC Managed Care – PPO | Admitting: Family Medicine

## 2014-04-03 ENCOUNTER — Encounter: Payer: Self-pay | Admitting: Family Medicine

## 2014-05-01 ENCOUNTER — Ambulatory Visit
Admission: RE | Admit: 2014-05-01 | Discharge: 2014-05-01 | Disposition: A | Discharge status: Home / Self Care | Payer: BC Managed Care – PPO | Source: Ambulatory Visit

## 2014-05-01 ENCOUNTER — Telehealth: Payer: Self-pay | Admitting: Internal Medicine

## 2014-05-01 DIAGNOSIS — Z1231 Encounter for screening mammogram for malignant neoplasm of breast: Secondary | ICD-10-CM

## 2014-05-01 NOTE — Telephone Encounter (Signed)
Pt called stating that she needs to come in 3 different days and get her bp checked. After she gets all 3 of her bp done, she has to have a letter of what her bp is. Can she just schedule nurse visits to come in or do ou need to see her

## 2014-05-01 NOTE — Telephone Encounter (Signed)
What is this for? Is it for DMV? Per last visit, BP's have all been normal.  If I need to write a letter, needs OV.  If she just needs the three values written (and they are all normal), and signed by me, we can do that without a visit--can be nurse visits.  So, truly it will depend on whether the BP's are normal or not, and if documentation can be a simple note, rather than a formal letter.  It would help to know what this is for.

## 2014-05-02 LAB — HM MAMMOGRAPHY

## 2014-05-02 NOTE — Telephone Encounter (Signed)
Pt states that it is through her work but from the [facility]. She wears a leg brace and they have her in this program and she is trying to get out of it so she has to have these bp readings. She states that it should only be a simple note stating what the 3 bp's are and then signed by you. She is suppose to have it done by the 10th but she is going to call and see if she can get an extension on it and see if she can come in during her christmas break when she is off work so she doesn't have to take days off to come in and she will get back to [place].

## 2014-05-02 NOTE — Telephone Encounter (Signed)
As long as it is just a signed statement/note of what her 3 BP readings are, it can be nurse visit

## 2014-06-28 ENCOUNTER — Ambulatory Visit (INDEPENDENT_AMBULATORY_CARE_PROVIDER_SITE_OTHER): Payer: BC Managed Care – PPO | Admitting: Family Medicine

## 2014-06-28 ENCOUNTER — Encounter: Payer: Self-pay | Admitting: Family Medicine

## 2014-06-28 VITALS — BP 114/86 | HR 60 | Ht 64.0 in | Wt 200.0 lb

## 2014-06-28 DIAGNOSIS — Z0000 Encounter for general adult medical examination without abnormal findings: Secondary | ICD-10-CM

## 2014-06-28 DIAGNOSIS — Z23 Encounter for immunization: Secondary | ICD-10-CM

## 2014-06-28 DIAGNOSIS — J45901 Unspecified asthma with (acute) exacerbation: Secondary | ICD-10-CM

## 2014-06-28 DIAGNOSIS — E781 Pure hyperglyceridemia: Secondary | ICD-10-CM

## 2014-06-28 DIAGNOSIS — R05 Cough: Secondary | ICD-10-CM

## 2014-06-28 DIAGNOSIS — E039 Hypothyroidism, unspecified: Secondary | ICD-10-CM

## 2014-06-28 DIAGNOSIS — N959 Unspecified menopausal and perimenopausal disorder: Secondary | ICD-10-CM

## 2014-06-28 DIAGNOSIS — R059 Cough, unspecified: Secondary | ICD-10-CM

## 2014-06-28 DIAGNOSIS — I10 Essential (primary) hypertension: Secondary | ICD-10-CM

## 2014-06-28 LAB — COMPREHENSIVE METABOLIC PANEL
ALT: 22 U/L (ref 0–35)
AST: 19 U/L (ref 0–37)
Albumin: 4.1 g/dL (ref 3.5–5.2)
Alkaline Phosphatase: 44 U/L (ref 39–117)
BUN: 18 mg/dL (ref 6–23)
CO2: 30 mEq/L (ref 19–32)
Calcium: 9.5 mg/dL (ref 8.4–10.5)
Chloride: 96 mEq/L (ref 96–112)
Creat: 0.68 mg/dL (ref 0.50–1.10)
Glucose, Bld: 85 mg/dL (ref 70–99)
Potassium: 4 mEq/L (ref 3.5–5.3)
Sodium: 135 mEq/L (ref 135–145)
Total Bilirubin: 0.5 mg/dL (ref 0.2–1.2)
Total Protein: 7 g/dL (ref 6.0–8.3)

## 2014-06-28 LAB — POCT URINALYSIS DIPSTICK
Bilirubin, UA: NEGATIVE
Blood, UA: NEGATIVE
Glucose, UA: NEGATIVE
Ketones, UA: NEGATIVE
Leukocytes, UA: NEGATIVE
Nitrite, UA: NEGATIVE
Protein, UA: NEGATIVE
Spec Grav, UA: 1.025
Urobilinogen, UA: NEGATIVE
pH, UA: 6

## 2014-06-28 LAB — CBC WITH DIFFERENTIAL/PLATELET
Basophils Absolute: 0 10*3/uL (ref 0.0–0.1)
Basophils Relative: 0 % (ref 0–1)
Eosinophils Absolute: 0.2 10*3/uL (ref 0.0–0.7)
Eosinophils Relative: 2 % (ref 0–5)
HCT: 38.8 % (ref 36.0–46.0)
Hemoglobin: 13.1 g/dL (ref 12.0–15.0)
Lymphocytes Relative: 35 % (ref 12–46)
Lymphs Abs: 3.4 10*3/uL (ref 0.7–4.0)
MCH: 32.6 pg (ref 26.0–34.0)
MCHC: 33.8 g/dL (ref 30.0–36.0)
MCV: 96.5 fL (ref 78.0–100.0)
MPV: 9.1 fL (ref 8.6–12.4)
Monocytes Absolute: 0.8 10*3/uL (ref 0.1–1.0)
Monocytes Relative: 8 % (ref 3–12)
Neutro Abs: 5.3 10*3/uL (ref 1.7–7.7)
Neutrophils Relative %: 55 % (ref 43–77)
Platelets: 275 10*3/uL (ref 150–400)
RBC: 4.02 MIL/uL (ref 3.87–5.11)
RDW: 12.9 % (ref 11.5–15.5)
WBC: 9.6 10*3/uL (ref 4.0–10.5)

## 2014-06-28 LAB — TSH: TSH: 2.347 u[IU]/mL (ref 0.350–4.500)

## 2014-06-28 LAB — LIPID PANEL
Cholesterol: 168 mg/dL (ref 0–200)
HDL: 46 mg/dL (ref >39)
LDL Cholesterol: 101 mg/dL — ABNORMAL HIGH (ref 0–99)
Total CHOL/HDL Ratio: 3.7 Ratio
Triglycerides: 103 mg/dL (ref <150)
VLDL: 21 mg/dL (ref 0–40)

## 2014-06-28 MED ORDER — LISINOPRIL-HYDROCHLOROTHIAZIDE 20-12.5 MG PO TABS: ORAL_TABLET | ORAL | Status: DC

## 2014-06-28 MED ORDER — ALBUTEROL SULFATE 0.63 MG/3ML IN NEBU: 1.0000 | INHALATION_SOLUTION | Freq: Four times a day (QID) | RESPIRATORY_TRACT | Status: DC | PRN

## 2014-06-28 MED ORDER — ALBUTEROL SULFATE HFA 108 (90 BASE) MCG/ACT IN AERS: 2.0000 | INHALATION_SPRAY | Freq: Four times a day (QID) | RESPIRATORY_TRACT | Status: DC | PRN

## 2014-06-28 MED ORDER — ESTROGENS, CONJUGATED 0.625 MG/GM VA CREA: TOPICAL_CREAM | VAGINAL | Status: DC
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
## 2014-06-28 NOTE — Patient Instructions (Addendum)
  HEALTH MAINTENANCE RECOMMENDATIONS:  It is recommended that you get at least 30 minutes of aerobic exercise at least 5 days/week (for weight loss, you may need as much as 60-90 minutes). This can be any activity that gets your heart rate up. This can be divided in 10-15 minute intervals if needed, but try and build up your endurance at least once a week.  Weight bearing exercise is also recommended twice weekly.  Eat a healthy diet with lots of vegetables, fruits and fiber.  "Colorful" foods have a lot of vitamins (ie green vegetables, tomatoes, red peppers, etc).  Limit sweet tea, regular sodas and alcoholic beverages, all of which has a lot of calories and sugar.  Up to 1 alcoholic drink daily may be beneficial for women (unless trying to lose weight, watch sugars).  Drink a lot of water.  Calcium recommendations are 1200-1500 mg daily (1500 mg for postmenopausal women or women without ovaries), and vitamin D 1000 IU daily.  This should be obtained from diet and/or supplements (vitamins), and calcium should not be taken all at once, but in divided doses.  Monthly self breast exams and yearly mammograms for women over the age of 37 is recommended.  Sunscreen of at least SPF 30 should be used on all sun-exposed parts of the skin when outside between the hours of 10 am and 4 pm (not just when at beach or pool, but even with exercise, golf, tennis, and yard work!)  Use a sunscreen that says "broad spectrum" so it covers both UVA and UVB rays, and make sure to reapply every 1-2 hours.  Remember to change the batteries in your smoke detectors when changing your clock times in the spring and fall.  Use your seat belt every time you are in a car, and please drive safely and not be distracted with cell phones and texting while driving.  Start taking plain Mucinex during the day, continue the DM version at bedtime. Continue to drink plenty of fluid. Use saline spray (you have some bleeding noted in the  nose). Use Coricidin to help with nasal congestion and drainage (which contributes to the cough).  Restart the premarin vaginal cream 3 times per week.  You may eventually (in 2-4 weeks, or longer, if needed) cut back to just 1-2 times/week, if that frequency is enough to control vaginal dryness.  If you need to continue 3x/week longterm, that is fine.

## 2014-06-29 ENCOUNTER — Encounter: Payer: Self-pay | Admitting: Family Medicine

## 2014-06-29 MED ORDER — SYNTHROID 125 MCG PO TABS: ORAL_TABLET | ORAL | Status: DC

## 2014-06-29 NOTE — Addendum Note (Signed)
Addended by: Rita Ohara on: 06/29/2014 08:38 AM   Modules accepted: Orders

## 2014-07-31 ENCOUNTER — Other Ambulatory Visit: Payer: BC Managed Care – PPO

## 2014-09-04 ENCOUNTER — Telehealth: Payer: Self-pay | Admitting: Internal Medicine

## 2014-09-04 MED ORDER — ESTRADIOL 2 MG VA RING: 2.0000 mg | VAGINAL_RING | VAGINAL | Status: DC

## 2014-09-04 NOTE — Telephone Encounter (Signed)
Pt would like to try the E-string for her estrogen and would like it called into [place]

## 2014-09-04 NOTE — Telephone Encounter (Signed)
Patient advised.

## 2014-09-04 NOTE — Telephone Encounter (Signed)
Advise pt that estring was sent to her pharmacy.  I'm sure she knows this, but she is to stop the premarin cream (likely she already did that--didn't like it, and is asking to switch back to the estring, which she previously took).

## 2014-09-18 ENCOUNTER — Other Ambulatory Visit: Payer: BC Managed Care – PPO

## 2014-09-19 ENCOUNTER — Other Ambulatory Visit: Payer: BC Managed Care – PPO

## 2014-09-20 ENCOUNTER — Other Ambulatory Visit: Payer: BC Managed Care – PPO

## 2014-09-20 ENCOUNTER — Ambulatory Visit: Payer: BC Managed Care – PPO | Admitting: Medical

## 2014-12-21 ENCOUNTER — Ambulatory Visit (INDEPENDENT_AMBULATORY_CARE_PROVIDER_SITE_OTHER): Payer: BC Managed Care – PPO | Admitting: Family Medicine

## 2014-12-21 ENCOUNTER — Encounter: Payer: Self-pay | Admitting: Family Medicine

## 2014-12-21 VITALS — BP 112/72 | HR 80 | Ht 64.0 in | Wt 191.0 lb

## 2014-12-21 DIAGNOSIS — E669 Obesity, unspecified: Secondary | ICD-10-CM | POA: Diagnosis not present

## 2014-12-21 DIAGNOSIS — E039 Hypothyroidism, unspecified: Secondary | ICD-10-CM | POA: Diagnosis not present

## 2014-12-21 DIAGNOSIS — J309 Allergic rhinitis, unspecified: Secondary | ICD-10-CM | POA: Diagnosis not present

## 2014-12-21 DIAGNOSIS — J45998 Other asthma: Secondary | ICD-10-CM | POA: Diagnosis not present

## 2014-12-21 DIAGNOSIS — I10 Essential (primary) hypertension: Secondary | ICD-10-CM

## 2014-12-21 MED ORDER — LISINOPRIL-HYDROCHLOROTHIAZIDE 20-12.5 MG PO TABS: ORAL_TABLET | ORAL | Status: DC

## 2014-12-21 NOTE — Progress Notes (Signed)
Chief Complaint  Patient presents with  . Hypertension    nonfasting med check.   Hypertension follow-up: Blood pressures are checked in frequently, always <676/19'J-09 for diastolic. Denies dizziness, headaches, chest pain, edema. Denies side effects of medications. Denies cough.   She has lost 9 pound since her last visit. She is walking more, at least 3 miles a day, at least 5 days/week.  She is using some weights 2-3 times/week, and walks with hand weights.  She has cut out preservatives, eating more salads, smaller portions at meals. She isn't having migraines since she cut out the preservatives.  Asthma and allergies--usually flare in spring and fall. Doing well now.  Hasn't needed to use albuterol in at least 6 months, and only related to an illness.  She uses the [employer] (spring/fall).  Hypothyroidism: Last TSH was normal 6 months ago. No changes in hair/skin/moods. Bowels are normal, energy is normal. She takes her thyroid medication at 4:30 in the morning, and takes her vitamins at lunchtime.  She reports that after her last visit, she finally remembered that she had gone to UC to discuss weight loss, but was too afraid to take the medication (that she filled)--threw them away and never took them.  PMH, PSH, SH reviewed Outpatient Encounter Prescriptions as of 12/21/2014  Medication Sig Note  . Cholecalciferol (VITAMIN D-3 PO) Take 1 tablet by mouth daily. 10/06/2012: Thinks dose is 1000mg   . estradiol (ESTRING) 2 MG vaginal ring Place 2 mg vaginally every 3 (three) months. follow package directions   . lisinopril-hydrochlorothiazide (PRINZIDE,ZESTORETIC) 20-12.5 MG per tablet TAKE 2 TABLETS ONCE A DAY   . Multiple Vitamins-Minerals (MULTIVITAMIN WITH MINERALS) tablet Take 1 tablet by mouth daily.   Marland Kitchen SYNTHROID 125 MCG tablet TAKE (1) TABLET DAILY BEFORE BREAKFAST.   . [DISCONTINUED] lisinopril-hydrochlorothiazide (PRINZIDE,ZESTORETIC) 20-12.5 MG per tablet TAKE 2 TABLETS  ONCE A DAY   . albuterol (ACCUNEB) 0.63 MG/3ML nebulizer solution Take 3 mLs (0.63 mg total) by nebulization every 6 (six) hours as needed for wheezing. (Patient not taking: Reported on 12/21/2014)   . albuterol (PROVENTIL HFA;VENTOLIN HFA) 108 (90 BASE) MCG/ACT inhaler Inhale 2 puffs into the lungs every 6 (six) hours as needed for wheezing. (Patient not taking: Reported on 12/21/2014)   . fexofenadine (ALLEGRA) 180 MG tablet Take 180 mg by mouth daily. 06/28/2014: Takes seasonally (not currently)  . fluticasone (FLONASE) 50 MCG/ACT nasal spray Place 2 sprays into the nose daily.   . [DISCONTINUED] dextromethorphan-guaiFENesin (MUCINEX DM) 30-600 MG per 12 hr tablet Take 1 tablet by mouth 2 (two) times daily. 06/28/2014: Using it just once daily, at bedtime (makes sleepy)   No facility-administered encounter medications on file as of 12/21/2014.   No Known Allergies  ROS: no fever, chills, headaches, dizziness, URI symptoms, cough, shortness of breath, chest pain, bleeding, bruising, rashes, dizziness, joint pains.  +intentional weight loss. No nausea, vomiting, bowel changes, hair/skin/nail changes, depression, fatigue or other complaints.  PHYSICAL EXAM: BP 112/72 mmHg  Pulse 80  Ht 5\' 4"  (1.626 m)  Wt 191 lb (86.637 kg)  BMI 32.77 kg/m2  Well developed, pleasant, overweight female in no distress HEENT: PERRL, EOMI, conjunctiva clear. OP clear Neck: no lymphadenopathy, thyromegaly or carotid bruit Heart: regular rate and rhythm without murmur Lungs: clear bilaterally Back: no CVA tenderness Abdomen: soft, nontender, no organomegaly or mass Extremities: no edema, 2+ pulses Skin: no rashes, or suspicious lesions Psych: normal mood, affect, hygiene and grooming Neuro: alert and oriented, normal cranial nerves, strength,  gait  ASSESSMENT/PLAN:  Essential hypertension, benign - controlled - Plan: lisinopril-hydrochlorothiazide (PRINZIDE,ZESTORETIC) 20-12.5 MG per tablet  Hypothyroidism,  unspecified hypothyroidism type - euthyroid by history. continue current medication  Allergic rhinitis, unspecified allergic rhinitis type - worse seasonally, controlled with current regimen  Asthma in remission  Obesity (BMI 30-39.9) - congratulated on weight loss; continue healthy diet, exercise   F/u 6 months--CPE

## 2014-12-21 NOTE — Patient Instructions (Signed)
Continue your current medications. Continue your exercise, healthy diet, and weight loss.

## 2015-02-03 ENCOUNTER — Emergency Department (HOSPITAL_COMMUNITY): Payer: No Typology Code available for payment source

## 2015-02-03 ENCOUNTER — Encounter (HOSPITAL_COMMUNITY): Payer: Self-pay | Admitting: Emergency Medicine

## 2015-02-03 ENCOUNTER — Emergency Department (HOSPITAL_COMMUNITY)
Admission: EM | Admit: 2015-02-03 | Discharge: 2015-02-03 | Disposition: A | Discharge status: Home / Self Care | Payer: No Typology Code available for payment source | Attending: Emergency Medicine | Admitting: Emergency Medicine

## 2015-02-03 DIAGNOSIS — Y9389 Activity, other specified: Secondary | ICD-10-CM | POA: Insufficient documentation

## 2015-02-03 DIAGNOSIS — Z79899 Other long term (current) drug therapy: Secondary | ICD-10-CM | POA: Insufficient documentation

## 2015-02-03 DIAGNOSIS — E781 Pure hyperglyceridemia: Secondary | ICD-10-CM | POA: Insufficient documentation

## 2015-02-03 DIAGNOSIS — E039 Hypothyroidism, unspecified: Secondary | ICD-10-CM | POA: Insufficient documentation

## 2015-02-03 DIAGNOSIS — M62838 Other muscle spasm: Secondary | ICD-10-CM | POA: Insufficient documentation

## 2015-02-03 DIAGNOSIS — G43909 Migraine, unspecified, not intractable, without status migrainosus: Secondary | ICD-10-CM | POA: Insufficient documentation

## 2015-02-03 DIAGNOSIS — R51 Headache: Secondary | ICD-10-CM

## 2015-02-03 DIAGNOSIS — I10 Essential (primary) hypertension: Secondary | ICD-10-CM | POA: Insufficient documentation

## 2015-02-03 DIAGNOSIS — R519 Headache, unspecified: Secondary | ICD-10-CM

## 2015-02-03 DIAGNOSIS — Y998 Other external cause status: Secondary | ICD-10-CM | POA: Insufficient documentation

## 2015-02-03 DIAGNOSIS — J45909 Unspecified asthma, uncomplicated: Secondary | ICD-10-CM | POA: Insufficient documentation

## 2015-02-03 DIAGNOSIS — S199XXA Unspecified injury of neck, initial encounter: Secondary | ICD-10-CM | POA: Insufficient documentation

## 2015-02-03 DIAGNOSIS — Z7982 Long term (current) use of aspirin: Secondary | ICD-10-CM | POA: Insufficient documentation

## 2015-02-03 DIAGNOSIS — Y92410 Unspecified street and highway as the place of occurrence of the external cause: Secondary | ICD-10-CM | POA: Insufficient documentation

## 2015-02-03 DIAGNOSIS — S0990XA Unspecified injury of head, initial encounter: Secondary | ICD-10-CM | POA: Insufficient documentation

## 2015-02-03 MED ORDER — ONDANSETRON HCL 4 MG/2ML IJ SOLN
4.0000 mg | Freq: Once | INTRAMUSCULAR | Status: AC
  Administered 2015-02-03: 4 mg via INTRAVENOUS
  Filled 2015-02-03: qty 2

## 2015-02-03 MED ORDER — METOCLOPRAMIDE HCL 5 MG/ML IJ SOLN
5.0000 mg | INTRAMUSCULAR | Status: AC
Start: 2015-02-03 — End: 2015-02-03
  Administered 2015-02-03: 5 mg via INTRAVENOUS
  Filled 2015-02-03: qty 2

## 2015-02-03 MED ORDER — METHOCARBAMOL 500 MG PO TABS
500.0000 mg | ORAL_TABLET | Freq: Once | ORAL | Status: AC
  Administered 2015-02-03: 500 mg via ORAL
  Filled 2015-02-03: qty 1

## 2015-02-03 MED ORDER — KETOROLAC TROMETHAMINE 30 MG/ML IJ SOLN
30.0000 mg | Freq: Once | INTRAMUSCULAR | Status: AC
  Administered 2015-02-03: 30 mg via INTRAVENOUS
  Filled 2015-02-03: qty 1

## 2015-02-03 MED ORDER — SODIUM CHLORIDE 0.9 % IV SOLN
Freq: Once | INTRAVENOUS | Status: AC
  Administered 2015-02-03: 05:00:00 via INTRAVENOUS

## 2015-02-03 MED ORDER — METHOCARBAMOL 500 MG PO TABS: 500.0000 mg | ORAL_TABLET | Freq: Two times a day (BID) | ORAL | Status: DC

## 2015-02-03 MED ORDER — NAPROXEN 500 MG PO TABS: 500.0000 mg | ORAL_TABLET | Freq: Two times a day (BID) | ORAL | Status: DC

## 2015-02-03 MED ORDER — FENTANYL CITRATE (PF) 100 MCG/2ML IJ SOLN
50.0000 ug | Freq: Once | INTRAMUSCULAR | Status: AC
  Administered 2015-02-03: 50 ug via INTRAVENOUS
  Filled 2015-02-03: qty 2
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
## 2015-02-03 NOTE — Discharge Instructions (Signed)
1. Medications: robaxin, naproxyn, usual home medications 2. Treatment: rest, drink plenty of fluids, gentle stretching as discussed, alternate ice and heat 3. Follow Up: Please followup with your primary doctor in 3 days for discussion of your diagnoses and further evaluation after today's visit; if you do not have a primary care doctor use the resource guide provided to find one;  Return to the ER for worsening headache, difficulty walking, loss of bowel or bladder control or other concerning symptoms

## 2015-02-12 ENCOUNTER — Encounter: Payer: Self-pay | Admitting: Family Medicine

## 2015-02-12 ENCOUNTER — Ambulatory Visit (INDEPENDENT_AMBULATORY_CARE_PROVIDER_SITE_OTHER): Payer: BC Managed Care – PPO | Admitting: Family Medicine

## 2015-02-12 VITALS — BP 110/78 | HR 64 | Ht 64.0 in | Wt 188.6 lb

## 2015-02-12 DIAGNOSIS — Z23 Encounter for immunization: Secondary | ICD-10-CM

## 2015-02-12 DIAGNOSIS — N6489 Other specified disorders of breast: Secondary | ICD-10-CM | POA: Diagnosis not present

## 2015-02-12 NOTE — Patient Instructions (Signed)
Apply warm compresses to the tender area on your right breast.  The breast itself is normal--this is a blocked gland/duct.

## 2015-02-12 NOTE — Progress Notes (Signed)
Chief Complaint  Patient presents with  . Breast Mass    noticed breast lump Thursday while showering. Some soreness, pea-sized. Does do monthly breast exams.    She checks her breasts the first of every month.  She doesn't recall feeling anything September 1st, but while showering a few days ago she noted a tender lump in her right breast, just below the nipple.  She hasn't noticed any change since she first noticed it. It remains slightly tender. Denies any fever, chills, drainage, or other lumps in the breast  Last mammogram was 04/2014.  PMH., PSH. SH and FH reviewed. Meds and Allergies reviewed  ROS: no nipple discharge, fever, chills, chest pain, headaches, dizziness, URI symptoms, other skin lesions/concerns, bleeding, bruising or other concerns.  PHYSICAL EXAM: BP 110/78 mmHg  Pulse 64  Ht 5\' 4"  (1.626 m)  Wt 188 lb 9.6 oz (85.548 kg)  BMI 32.36 kg/m2 Well developed, pleasant female in no distress Right breast: In the inferior portion of the areola there are 3 small prominent glands/superficial raised areas.  The one furthest to the right (6:30 location) is the one that is slightly tender, with slight soft tissue swelling/firmness within the skin just below this.  There is no erythema, warmth or fluctuance. There is no prominent pore, pustule, skin is normal. Breast exam was performed and was completely normal. No masses, no axillary lymphadenopathy.  ASSESSMENT/PLAN:  Occlusion of breast duct - reassured there was no breast mass; warm compresses  Need for prophylactic vaccination and inoculation against influenza - Plan: Flu Vaccine QUAD 36+ mos PF IM (Fluarix & Fluzone Quad PF)   Cyst within skin of right breast  Pt was reassured that this lump is within the skin, and not within the breast Apply warm compresses  F/u prn

## 2015-03-29 ENCOUNTER — Emergency Department (HOSPITAL_COMMUNITY)
Admission: EM | Admit: 2015-03-29 | Discharge: 2015-03-29 | Disposition: A | Discharge status: Home / Self Care | Payer: BC Managed Care – PPO | Attending: Emergency Medicine | Admitting: Emergency Medicine

## 2015-03-29 ENCOUNTER — Encounter (HOSPITAL_COMMUNITY): Payer: Self-pay | Admitting: Emergency Medicine

## 2015-03-29 ENCOUNTER — Institutional Professional Consult (permissible substitution): Payer: BC Managed Care – PPO | Admitting: Family Medicine

## 2015-03-29 DIAGNOSIS — Z7982 Long term (current) use of aspirin: Secondary | ICD-10-CM | POA: Diagnosis not present

## 2015-03-29 DIAGNOSIS — R110 Nausea: Secondary | ICD-10-CM | POA: Diagnosis not present

## 2015-03-29 DIAGNOSIS — R55 Syncope and collapse: Secondary | ICD-10-CM | POA: Diagnosis not present

## 2015-03-29 DIAGNOSIS — E039 Hypothyroidism, unspecified: Secondary | ICD-10-CM | POA: Insufficient documentation

## 2015-03-29 DIAGNOSIS — J45909 Unspecified asthma, uncomplicated: Secondary | ICD-10-CM | POA: Diagnosis not present

## 2015-03-29 DIAGNOSIS — Z79899 Other long term (current) drug therapy: Secondary | ICD-10-CM | POA: Diagnosis not present

## 2015-03-29 DIAGNOSIS — R61 Generalized hyperhidrosis: Secondary | ICD-10-CM | POA: Diagnosis not present

## 2015-03-29 DIAGNOSIS — I10 Essential (primary) hypertension: Secondary | ICD-10-CM | POA: Diagnosis not present

## 2015-03-29 LAB — CBC WITH DIFFERENTIAL/PLATELET
Basophils Absolute: 0 10*3/uL (ref 0.0–0.1)
Basophils Relative: 1 %
Eosinophils Absolute: 0.2 10*3/uL (ref 0.0–0.7)
Eosinophils Relative: 4 %
HCT: 37.8 % (ref 36.0–46.0)
Hemoglobin: 13.2 g/dL (ref 12.0–15.0)
Lymphocytes Relative: 20 %
Lymphs Abs: 1.3 10*3/uL (ref 0.7–4.0)
MCH: 33.2 pg (ref 26.0–34.0)
MCHC: 34.9 g/dL (ref 30.0–36.0)
MCV: 95.2 fL (ref 78.0–100.0)
Monocytes Absolute: 0.5 10*3/uL (ref 0.1–1.0)
Monocytes Relative: 8 %
Neutro Abs: 4.5 10*3/uL (ref 1.7–7.7)
Neutrophils Relative %: 67 %
Platelets: 192 10*3/uL (ref 150–400)
RBC: 3.97 MIL/uL (ref 3.87–5.11)
RDW: 12.4 % (ref 11.5–15.5)
WBC: 6.6 10*3/uL (ref 4.0–10.5)

## 2015-03-29 LAB — URINALYSIS, ROUTINE W REFLEX MICROSCOPIC
Bilirubin Urine: NEGATIVE
Glucose, UA: NEGATIVE mg/dL
Hgb urine dipstick: NEGATIVE
Ketones, ur: NEGATIVE mg/dL
Leukocytes, UA: NEGATIVE
Nitrite: NEGATIVE
Protein, ur: NEGATIVE mg/dL
Specific Gravity, Urine: 1.01 (ref 1.005–1.030)
Urobilinogen, UA: 0.2 mg/dL (ref 0.0–1.0)
pH: 6.5 (ref 5.0–8.0)

## 2015-03-29 LAB — BASIC METABOLIC PANEL
Anion gap: 6 (ref 5–15)
BUN: 14 mg/dL (ref 6–20)
CO2: 30 mmol/L (ref 22–32)
Calcium: 9 mg/dL (ref 8.9–10.3)
Chloride: 101 mmol/L (ref 101–111)
Creatinine, Ser: 0.64 mg/dL (ref 0.44–1.00)
GFR calc Af Amer: >60 mL/min (ref >60)
GFR calc non Af Amer: >60 mL/min (ref >60)
Glucose, Bld: 115 mg/dL — ABNORMAL HIGH (ref 65–99)
Potassium: 3.6 mmol/L (ref 3.5–5.1)
Sodium: 137 mmol/L (ref 135–145)

## 2015-03-29 LAB — TROPONIN I: Troponin I: <0.03 ng/mL (ref <0.031)

## 2015-03-29 NOTE — Discharge Instructions (Signed)
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
## 2015-03-29 NOTE — ED Notes (Signed)
Pt had syncopal episode x 1 hour ago while at [employer]. Pt states she took allegra and coricidin this am. Pt responses to voice. States she feels sleepy.

## 2015-04-03 ENCOUNTER — Ambulatory Visit: Payer: BC Managed Care – PPO | Admitting: Physical Therapy

## 2015-04-04 ENCOUNTER — Encounter: Payer: Self-pay | Admitting: Family Medicine

## 2015-04-04 ENCOUNTER — Ambulatory Visit (INDEPENDENT_AMBULATORY_CARE_PROVIDER_SITE_OTHER): Payer: BC Managed Care – PPO | Admitting: Family Medicine

## 2015-04-04 VITALS — BP 110/76 | HR 72 | Temp 98.2°F | Ht 64.0 in | Wt 191.0 lb

## 2015-04-04 DIAGNOSIS — L659 Nonscarring hair loss, unspecified: Secondary | ICD-10-CM | POA: Diagnosis not present

## 2015-04-04 DIAGNOSIS — R55 Syncope and collapse: Secondary | ICD-10-CM

## 2015-04-04 DIAGNOSIS — I10 Essential (primary) hypertension: Secondary | ICD-10-CM | POA: Diagnosis not present

## 2015-04-04 DIAGNOSIS — E039 Hypothyroidism, unspecified: Secondary | ICD-10-CM

## 2015-04-04 LAB — TSH: TSH: 1.572 u[IU]/mL (ref 0.350–4.500)
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
## 2015-04-04 NOTE — Patient Instructions (Signed)
Make sure you are drinking plenty of fluids. Don't use Coricidin with the Allegra. Continue Allegra and Flonase.  We will be in touch through MyChart with your thyroid results. We may also call you, if dose needs to be changed and a follow up lab visit is needed.  If your thyroid dose is correct, then let's keep an eye on your symptoms--hopefully hair loss settles down. Consider taking a hair/skin/nails vitamin. For dry skin--drink plenty of water, avoid prolonged hot showers, moisturize twice daily. Consider humidifier if having nasal bleeding or dryness and if your house is very dry (from heat being on).

## 2015-04-12 ENCOUNTER — Other Ambulatory Visit: Payer: Self-pay

## 2015-04-12 DIAGNOSIS — Z1231 Encounter for screening mammogram for malignant neoplasm of breast: Secondary | ICD-10-CM

## 2015-05-17 ENCOUNTER — Ambulatory Visit: Payer: BC Managed Care – PPO

## 2015-05-18 ENCOUNTER — Ambulatory Visit
Admission: RE | Admit: 2015-05-18 | Discharge: 2015-05-18 | Disposition: A | Discharge status: Home / Self Care | Payer: BC Managed Care – PPO | Source: Ambulatory Visit

## 2015-05-18 DIAGNOSIS — Z1231 Encounter for screening mammogram for malignant neoplasm of breast: Secondary | ICD-10-CM

## 2015-07-16 ENCOUNTER — Encounter: Payer: BC Managed Care – PPO | Admitting: Family Medicine

## 2015-07-20 ENCOUNTER — Telehealth: Payer: Self-pay | Admitting: Family Medicine

## 2015-07-20 DIAGNOSIS — I10 Essential (primary) hypertension: Secondary | ICD-10-CM

## 2015-07-20 DIAGNOSIS — E039 Hypothyroidism, unspecified: Secondary | ICD-10-CM

## 2015-07-20 MED ORDER — LISINOPRIL-HYDROCHLOROTHIAZIDE 20-12.5 MG PO TABS: ORAL_TABLET | ORAL | Status: DC

## 2015-07-20 MED ORDER — SYNTHROID 125 MCG PO TABS: ORAL_TABLET | ORAL | Status: DC

## 2015-07-20 NOTE — Telephone Encounter (Addendum)
Pt called to see if she could schedule CPE with Vickie on 3/23 or 3/24 just one time. When I explained that we really like for pt's to schedule CPE's with their PCP she further explained that she cancelled her CPE that was scheduled on 2/13 with Dr Tomi Bamberger because her daughter lost her baby that day and she had to be with her. Pt began to cry. She wants to get her CPE on 3/23 or 3/24 since those are days that she has off at work and she knows that Dr Tomi Bamberger is booked out a ways for CPE and pt needs her meds refilled. She needs Synthroid [employer]) today because she took the last one and then she will also need Lisinopril ([facility] in [place]). I advised that I would check for approval to see if she can have CPE with Vickie or just get enough refill to last until next available CPE with Dr Tomi Bamberger (and place her on cancellation list).

## 2015-07-20 NOTE — Telephone Encounter (Signed)
Since she had thyroid labs done in November, and BP's have been fine, I'm okay with refilling both meds until next available CPE (Aug/Sept, and put her on a cancellation list for sooner).  If she has concerns about her BP or thyroid prior to that CPE, she can always schedule OV/med check sooner.  Refills were done for 6 months to the appropriate pharmacies.  Please advise patient  (If she truly would prefer to have it done that day in March, I'm fine with her seeing Vickie, but I'm happy to try and accommodate and refill her meds in order to wait to see me).

## 2015-07-20 NOTE — Telephone Encounter (Signed)
Called pt to give her Dr Johnsie Kindred message and her options. Pt was very appreciative that 6 months of refills were given and she wanted to make the appt with Dr Tomi Bamberger so we resched that appt and she understood to make a sooner appt if any concerns came up before that CPE appt.

## 2015-12-19 ENCOUNTER — Encounter: Payer: Self-pay | Admitting: Family Medicine

## 2015-12-19 ENCOUNTER — Ambulatory Visit (INDEPENDENT_AMBULATORY_CARE_PROVIDER_SITE_OTHER): Payer: BC Managed Care – PPO | Admitting: Family Medicine

## 2015-12-19 VITALS — BP 140/80 | HR 64 | Resp 16 | Ht 64.5 in | Wt 200.2 lb

## 2015-12-19 DIAGNOSIS — Z0000 Encounter for general adult medical examination without abnormal findings: Secondary | ICD-10-CM | POA: Diagnosis not present

## 2015-12-19 DIAGNOSIS — I10 Essential (primary) hypertension: Secondary | ICD-10-CM

## 2015-12-19 DIAGNOSIS — J309 Allergic rhinitis, unspecified: Secondary | ICD-10-CM | POA: Diagnosis not present

## 2015-12-19 DIAGNOSIS — J45998 Other asthma: Secondary | ICD-10-CM | POA: Diagnosis not present

## 2015-12-19 DIAGNOSIS — Z1159 Encounter for screening for other viral diseases: Secondary | ICD-10-CM | POA: Diagnosis not present

## 2015-12-19 DIAGNOSIS — E039 Hypothyroidism, unspecified: Secondary | ICD-10-CM

## 2015-12-19 DIAGNOSIS — F4321 Adjustment disorder with depressed mood: Secondary | ICD-10-CM | POA: Diagnosis not present

## 2015-12-19 DIAGNOSIS — R5383 Other fatigue: Secondary | ICD-10-CM | POA: Diagnosis not present

## 2015-12-19 LAB — POCT URINALYSIS DIPSTICK
Bilirubin, UA: NEGATIVE
Blood, UA: NEGATIVE
Glucose, UA: NEGATIVE
Ketones, UA: NEGATIVE
Leukocytes, UA: NEGATIVE
Nitrite, UA: NEGATIVE
Protein, UA: NEGATIVE
Spec Grav, UA: 1.02
Urobilinogen, UA: NEGATIVE
pH, UA: 8

## 2015-12-19 LAB — CBC WITH DIFFERENTIAL/PLATELET
Basophils Absolute: 0 cells/uL (ref 0–200)
Basophils Relative: 0 %
Eosinophils Absolute: 195 cells/uL (ref 15–500)
Eosinophils Relative: 3 %
HCT: 43.2 % (ref 35.0–45.0)
Hemoglobin: 14.6 g/dL (ref 11.7–15.5)
Lymphocytes Relative: 44 %
Lymphs Abs: 2860 cells/uL (ref 850–3900)
MCH: 32.4 pg (ref 27.0–33.0)
MCHC: 33.8 g/dL (ref 32.0–36.0)
MCV: 95.8 fL (ref 80.0–100.0)
MPV: 9.7 fL (ref 7.5–12.5)
Monocytes Absolute: 520 cells/uL (ref 200–950)
Monocytes Relative: 8 %
Neutro Abs: 2925 cells/uL (ref 1500–7800)
Neutrophils Relative %: 45 %
Platelets: 242 10*3/uL (ref 140–400)
RBC: 4.51 MIL/uL (ref 3.80–5.10)
RDW: 13.2 % (ref 11.0–15.0)
WBC: 6.5 10*3/uL (ref 4.0–10.5)

## 2015-12-19 LAB — TSH: TSH: 0.65 mIU/L
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
## 2015-12-19 NOTE — Patient Instructions (Addendum)
  HEALTH MAINTENANCE RECOMMENDATIONS:  It is recommended that you get at least 30 minutes of aerobic exercise at least 5 days/week (for weight loss, you may need as much as 60-90 minutes). This can be any activity that gets your heart rate up. This can be divided in 10-15 minute intervals if needed, but try and build up your endurance at least once a week.  Weight bearing exercise is also recommended twice weekly.  Eat a healthy diet with lots of vegetables, fruits and fiber.  "Colorful" foods have a lot of vitamins (ie green vegetables, tomatoes, red peppers, etc).  Limit sweet tea, regular sodas and alcoholic beverages, all of which has a lot of calories and sugar.  Up to 1 alcoholic drink daily may be beneficial for women (unless trying to lose weight, watch sugars).  Drink a lot of water.  Calcium recommendations are 1200-1500 mg daily (1500 mg for postmenopausal women or women without ovaries), and vitamin D 1000 IU daily.  This should be obtained from diet and/or supplements (vitamins), and calcium should not be taken all at once, but in divided doses.  Monthly self breast exams and yearly mammograms for women over the age of 52 is recommended.  Sunscreen of at least SPF 30 should be used on all sun-exposed parts of the skin when outside between the hours of 10 am and 4 pm (not just when at beach or pool, but even with exercise, golf, tennis, and yard work!)  Use a sunscreen that says "broad spectrum" so it covers both UVA and UVB rays, and make sure to reapply every 1-2 hours.  Remember to change the batteries in your smoke detectors when changing your clock times in the spring and fall.  Use your seat belt every time you are in a car, and please drive safely and not be distracted with cell phones and texting while driving.  I strongly encourage that you get counseling (either through hospice, or elsewhere--I'm giving you Almyra Free Whitt's card, through [facility]).  Call [facility] to schedule your  colonoscopy.  Your blood pressure was up a little today.  Be sure to check it elsewhere, and let us know if consistently >140/90.  Continue regular exercise, and low sodium diet. Try and get more sleep (go to bed earlier, use melatonin or benadryl if needed to help you get to sleep).

## 2015-12-20 LAB — LIPID PANEL
Cholesterol: 166 mg/dL (ref 125–200)
HDL: 52 mg/dL (ref >=46)
LDL Cholesterol: 82 mg/dL (ref <130)
Total CHOL/HDL Ratio: 3.2 Ratio (ref <=5.0)
Triglycerides: 160 mg/dL — ABNORMAL HIGH (ref <150)
VLDL: 32 mg/dL — ABNORMAL HIGH (ref <30)

## 2015-12-20 LAB — COMPREHENSIVE METABOLIC PANEL
ALT: 26 U/L (ref 6–29)
AST: 19 U/L (ref 10–35)
Albumin: 4.7 g/dL (ref 3.6–5.1)
Alkaline Phosphatase: 47 U/L (ref 33–130)
BUN: 16 mg/dL (ref 7–25)
CO2: 28 mmol/L (ref 20–31)
Calcium: 9.8 mg/dL (ref 8.6–10.4)
Chloride: 102 mmol/L (ref 98–110)
Creat: 0.75 mg/dL (ref 0.50–0.99)
Glucose, Bld: 87 mg/dL (ref 65–99)
Potassium: 4 mmol/L (ref 3.5–5.3)
Sodium: 142 mmol/L (ref 135–146)
Total Bilirubin: 0.7 mg/dL (ref 0.2–1.2)
Total Protein: 7.6 g/dL (ref 6.1–8.1)

## 2015-12-20 LAB — HEPATITIS C ANTIBODY: HCV Ab: NEGATIVE

## 2015-12-20 LAB — VITAMIN D 25 HYDROXY (VIT D DEFICIENCY, FRACTURES): Vit D, 25-Hydroxy: 27 ng/mL — ABNORMAL LOW (ref 30–100)

## 2016-01-17 ENCOUNTER — Other Ambulatory Visit: Payer: Self-pay | Admitting: Family Medicine

## 2016-01-17 DIAGNOSIS — E039 Hypothyroidism, unspecified: Secondary | ICD-10-CM

## 2016-02-14 ENCOUNTER — Other Ambulatory Visit: Payer: Self-pay | Admitting: Family Medicine

## 2016-02-14 DIAGNOSIS — I10 Essential (primary) hypertension: Secondary | ICD-10-CM

## 2016-04-14 ENCOUNTER — Other Ambulatory Visit: Payer: Self-pay | Admitting: Family Medicine

## 2016-04-14 DIAGNOSIS — Z1231 Encounter for screening mammogram for malignant neoplasm of breast: Secondary | ICD-10-CM

## 2016-05-20 ENCOUNTER — Ambulatory Visit
Admission: RE | Admit: 2016-05-20 | Discharge: 2016-05-20 | Disposition: A | Discharge status: Home / Self Care | Payer: BC Managed Care – PPO | Source: Ambulatory Visit | Attending: Family Medicine | Admitting: Family Medicine

## 2016-05-20 DIAGNOSIS — Z1231 Encounter for screening mammogram for malignant neoplasm of breast: Secondary | ICD-10-CM

## 2016-05-23 ENCOUNTER — Other Ambulatory Visit: Payer: Self-pay | Admitting: Family Medicine

## 2016-05-23 DIAGNOSIS — I10 Essential (primary) hypertension: Secondary | ICD-10-CM

## 2016-05-27 ENCOUNTER — Other Ambulatory Visit: Payer: Self-pay | Admitting: Family Medicine

## 2016-05-27 DIAGNOSIS — I10 Essential (primary) hypertension: Secondary | ICD-10-CM

## 2016-07-03 ENCOUNTER — Other Ambulatory Visit: Payer: Self-pay | Admitting: Family Medicine

## 2016-07-03 DIAGNOSIS — E039 Hypothyroidism, unspecified: Secondary | ICD-10-CM

## 2016-07-07 ENCOUNTER — Encounter: Payer: BC Managed Care – PPO | Admitting: Family Medicine

## 2016-08-06 ENCOUNTER — Other Ambulatory Visit: Payer: Self-pay | Admitting: Gastroenterology

## 2016-09-12 ENCOUNTER — Other Ambulatory Visit (INDEPENDENT_AMBULATORY_CARE_PROVIDER_SITE_OTHER): Payer: BC Managed Care – PPO

## 2016-09-12 DIAGNOSIS — Z111 Encounter for screening for respiratory tuberculosis: Secondary | ICD-10-CM | POA: Diagnosis not present

## 2016-09-14 NOTE — Progress Notes (Signed)
Chief Complaint  Patient presents with  . Hypothyroidism    nonfasting med check.    Last week her upper back pain started flaring, now radiating into the right arm.  She has an appointment scheduled with Dr. Trenton Gammon. She denies numbness or weakness.   Hypertension follow-up: Blood pressures are checked occasionally at home; recently it was 121/68 (2 weeks ago). Denies dizziness, headaches, chest pain, edema. Denies side effects of medications. Denies cough.   She continues only rarely have migraines since she cut out the preservatives from her diet. Very rare, relieved by OTC medication.  Hypothyroidism: Last TSH was 0.65 in July 2017.  No changes iun energy, bowels, weight (intentionally has lost some).  She does report dry skin more significant than typical for winter, as well a some hair loss.  She takes her thyroid medication at 4:30 in the morning, and takes her vitamins at lunchtime. Denies any missed doses.  "I don't like being retired".  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  Volunteers for [employer]. Moods are significantly improved since last visit (see prior note); expecting another granddaughter in May.  Asthma and allergies:  Allergies flare in Spring and Fall.  In the past she has used Flonase at earliest onset of allergies, and that keeps things in control. She has been taking Allegra (since end of February), but hasn't needed the flonase yet.  Only has flare of asthma when allergies are not well controlled.  Can't recall the last time she used her albuterol inhaler, didn't need it in the Fall at all. Doing better since not in the school.  Vitamin D deficiency:  Last level was low at 27 in 12/2015 (was normal prior to that in 2014).  She has been taking 1000 IU of Vitamin D3 daily.  Scheduled for colonoscopy next month.  PMH, PSH, SH reviewed  Outpatient Encounter Prescriptions as of 09/15/2016   Medication Sig Note  . aspirin EC 81 MG tablet Take 81 mg by mouth daily.   . Calcium Carbonate-Vitamin D (CALCIUM-VITAMIN D) 500-200 MG-UNIT per tablet Take 1 tablet by mouth 2 (two) times daily.    . fexofenadine (ALLEGRA) 180 MG tablet Take 180 mg by mouth daily. Reported on 12/19/2015 12/19/2015: Uses seasonally in Spring and Fall  . lisinopril-hydrochlorothiazide (PRINZIDE,ZESTORETIC) 20-12.5 MG tablet Take 2 tablets by mouth daily.   . Multiple Vitamins-Minerals (MULTIVITAMIN WITH MINERALS) tablet Take 1 tablet by mouth daily.   . Omega-3 Fatty Acids (FISH OIL PO) Take 1 capsule by mouth 2 (two) times daily.    Marland Kitchen SYNTHROID 125 MCG tablet TAKE (1) TABLET DAILY BEFORE BREAKFAST.   . [DISCONTINUED] lisinopril-hydrochlorothiazide (PRINZIDE,ZESTORETIC) 20-12.5 MG tablet TAKE TWO TABLETS BY MOUTH ONCE DAILY   . albuterol (PROVENTIL HFA;VENTOLIN HFA) 108 (90 Base) MCG/ACT inhaler Inhale 2 puffs into the lungs every 6 (six) hours as needed for wheezing or shortness of breath. Reported on 12/19/2015 12/19/2015: Uses prn; not needing. Last used almost a year ago   No facility-administered encounter medications on file as of 09/15/2016.    Took 3 extra strength excedrin, followed by 2 advil a few hours later for her back.  No Known Allergies  ROS:  No fever, chills, headaches, numbness, tingling, chest pain, palpitations, shortness of breath, URI/allergy symptoms, cough.  Denies nausea, vomiting, bowel changes, bleeding, bruising, rash.  Energy is good.  See HPI. Moods are good.  PHYSICAL EXAM: BP 128/76 (  BP Location: Left Arm, Patient Position: Sitting, Cuff Size: Normal)   Pulse 76   Ht 5' 4.5" (1.638 m)   Wt 208 lb 6.4 oz (94.5 kg)   BMI 35.22 kg/m  Pleasant female, pacing room, holding arms behind her back, due to upper back discomfort. HEENT: conjunctiva and sclera are clear, PERRL, EOMI.  Nasal mucosa is normal, OP clear. Sinuses nontender Neck: no lymphadenopathy, thyromegaly, carotid  bruit. c-spine nontender Back: no spinal tenderness, CVA tenderness or muscle spasm Lungs: clear bilaterally Heart: regular rate and rhythm Abdomen: soft, nontender, no organomegaly or mass Extremities: no edema, normal pulses Neuro: Normal strength, sensation, DTR's normal and symemtric in upper extremities Psych :normal mood, affect, hygiene and grooming  ASSESSMENT/PLAN:  Essential hypertension, benign - Plan: Comprehensive metabolic panel, lisinopril-hydrochlorothiazide (PRINZIDE,ZESTORETIC) 20-12.5 MG tablet  Asthma in remission - usualy flares with allergy flares, but has been well controlled.  no albuterol [place] needed in a long time  Hypothyroidism, unspecified type - some dry skin and hair loss, recheck TSH - Plan: TSH  Seasonal allergic rhinitis, unspecified trigger - currently controlled with allegra.  restart Flonase if needed  Vitamin D deficiency - continue 1000 IU daily; adjust as needed based on labs - Plan: VITAMIN D 25 Hydroxy (Vit-D Deficiency, Fractures)  Pain of right upper extremity - suspect cervical radiculopathy, given h/o upper back/lower neck pain.  Normal exam. Discussed NSAID use and risks/precautions. has appt with Dr. Trenton Gammon   TSH, Vit D, c-met (nonfasting today)  Will need glucose and lipids (when fasting) for her CPE   f/u at CPE 6 months  Needs refill BRANDED synthroid after labs back ([facility]).   Stop the excedrin. Instead, take 3-4 ibuprofen/advil together with food, every 8 hours. If it bothers you stomach, cut back on the dose. If you still need more pain control, you can use extra strength tylenol along with the ibuprofen.  Please do not take any other pain medications (no excedrin, aleve, Goody/BC powder, etc).  These all affect the stomach and kidneys the same way.   Addendum: Lab Results  Component Value Date   TSH 4.98 (H) 09/15/2016   Dose increased to 193mg and will f/u lab visit in 6-8 weeks to recheck. Sent in new rx for #90,  no refill.

## 2016-09-15 ENCOUNTER — Encounter: Payer: Self-pay | Admitting: Family Medicine

## 2016-09-15 ENCOUNTER — Ambulatory Visit (INDEPENDENT_AMBULATORY_CARE_PROVIDER_SITE_OTHER): Payer: BC Managed Care – PPO | Admitting: Family Medicine

## 2016-09-15 VITALS — BP 128/76 | HR 76 | Ht 64.5 in | Wt 208.4 lb

## 2016-09-15 DIAGNOSIS — E039 Hypothyroidism, unspecified: Secondary | ICD-10-CM

## 2016-09-15 DIAGNOSIS — M79601 Pain in right arm: Secondary | ICD-10-CM

## 2016-09-15 DIAGNOSIS — I10 Essential (primary) hypertension: Secondary | ICD-10-CM | POA: Diagnosis not present

## 2016-09-15 DIAGNOSIS — J45998 Other asthma: Secondary | ICD-10-CM

## 2016-09-15 DIAGNOSIS — E559 Vitamin D deficiency, unspecified: Secondary | ICD-10-CM | POA: Diagnosis not present

## 2016-09-15 DIAGNOSIS — J302 Other seasonal allergic rhinitis: Secondary | ICD-10-CM

## 2016-09-15 LAB — COMPREHENSIVE METABOLIC PANEL
ALT: 28 U/L (ref 6–29)
AST: 23 U/L (ref 10–35)
Albumin: 4.4 g/dL (ref 3.6–5.1)
Alkaline Phosphatase: 44 U/L (ref 33–130)
BUN: 17 mg/dL (ref 7–25)
CO2: 28 mmol/L (ref 20–31)
Calcium: 9.7 mg/dL (ref 8.6–10.4)
Chloride: 102 mmol/L (ref 98–110)
Creat: 0.76 mg/dL (ref 0.50–0.99)
Glucose, Bld: 95 mg/dL (ref 65–99)
Potassium: 3.4 mmol/L — ABNORMAL LOW (ref 3.5–5.3)
Sodium: 142 mmol/L (ref 135–146)
Total Bilirubin: 0.4 mg/dL (ref 0.2–1.2)
Total Protein: 7.4 g/dL (ref 6.1–8.1)

## 2016-09-15 LAB — TSH: TSH: 4.98 mIU/L — ABNORMAL HIGH

## 2016-09-15 LAB — TB SKIN TEST: TB Skin Test: NEGATIVE

## 2016-09-15 MED ORDER — LISINOPRIL-HYDROCHLOROTHIAZIDE 20-12.5 MG PO TABS: 2.0000 | ORAL_TABLET | Freq: Every day | ORAL | 1 refills | Status: DC

## 2016-09-15 NOTE — Patient Instructions (Signed)
  Stop the excedrin. Instead, take 3-4 ibuprofen/advil together with food, every 8 hours. If it bothers you stomach, cut back on the dose. If you still need more pain control, you can use extra strength tylenol along with the ibuprofen.  Please do not take any other pain medications (no excedrin, aleve, Goody/BC powder, etc).  These all affect the stomach and kidneys the same way.

## 2016-09-16 ENCOUNTER — Telehealth: Payer: Self-pay | Admitting: Family Medicine

## 2016-09-16 LAB — VITAMIN D 25 HYDROXY (VIT D DEFICIENCY, FRACTURES): Vit D, 25-Hydroxy: 31 ng/mL (ref 30–100)

## 2016-09-16 MED ORDER — SYNTHROID 137 MCG PO TABS: 137.0000 ug | ORAL_TABLET | Freq: Every day | ORAL | 0 refills | Status: DC

## 2016-09-16 NOTE — Telephone Encounter (Signed)
Ensure that she is taking 800mg  of ibuprofen three times daily with food (she hadn't been taking that much prior to her visit).  She may use extra strength tylenol along with that, if needed for pain.  I doubt we can get her in sooner, but you can go ahead and try and call.  She was told that they didn't have a cancellation list, that she would have to call every day hoping for one.  If needed, offer #15 of tramadol 50mg  (1-2 q6 hr prn severe pain), and advise of potential sedation.  She can use this in addition to the other meds for pain discussed above (and SHOULD continue the ibuprofen three times daily).  Hopefully that will help keep her out of the ER while waiting to get in to see Dr. Irven [place] office.

## 2016-09-16 NOTE — Telephone Encounter (Signed)
Pt says she has an appointment with Dr Trenton Gammon on 09/25/16. Pt is in a lot of pain and having difficulty lifting arm. She does not feel that she will make it until 4/26. Can Dr Tomi Bamberger get her seen sooner so that she does not end up at the ER?

## 2016-09-17 MED ORDER — TRAMADOL HCL 50 MG PO TABS: 50.0000 mg | ORAL_TABLET | Freq: Four times a day (QID) | ORAL | 0 refills | Status: DC | PRN

## 2016-09-17 NOTE — Telephone Encounter (Signed)
Pt called & states got a call & has MRI scheduled tomorrow so there is no need to call

## 2016-09-17 NOTE — Telephone Encounter (Signed)
Spoke with patient and she would still like the tramadol called in. Called to [facility].

## 2016-11-03 ENCOUNTER — Encounter (HOSPITAL_COMMUNITY): Payer: Self-pay | Admitting: *Deleted

## 2016-11-03 ENCOUNTER — Telehealth: Payer: Self-pay | Admitting: *Deleted

## 2016-11-03 NOTE — Telephone Encounter (Signed)
Rescheduled her for 12/08/16. She has enough medication to last until then.

## 2016-11-03 NOTE — Telephone Encounter (Signed)
Patient called and has had a sick grandchild at [facility] and has been a little busy and crazy. Coming in this Wed to have TSH checked. Missed her synthroid this past Sat and Sunday. Also for the last 2 weeks has NOT been taking first thing in the morning on empty stomach before any meds, has just been taking it when she remembers due to crazy schedule. Wants to know if she should still come Wed to have TSH checked or should she reschedule the lab visit? Please advise, thanks.

## 2016-11-03 NOTE — Telephone Encounter (Signed)
Ok to reschedule for another month.  Okay to refill for a month, if needed.  Try and be more regular and take meds properly so we can determine if the new dose is correct

## 2016-11-04 ENCOUNTER — Ambulatory Visit (HOSPITAL_COMMUNITY)
Admission: RE | Admit: 2016-11-04 | Discharge: 2016-11-04 | Disposition: A | Discharge status: Home / Self Care | Payer: BC Managed Care – PPO | Source: Ambulatory Visit | Attending: Gastroenterology | Admitting: Gastroenterology

## 2016-11-04 ENCOUNTER — Ambulatory Visit (HOSPITAL_COMMUNITY): Payer: BC Managed Care – PPO | Admitting: Certified Registered Nurse Anesthetist

## 2016-11-04 ENCOUNTER — Encounter (HOSPITAL_COMMUNITY)
Admission: RE | Disposition: A | Discharge status: Home / Self Care | Payer: Self-pay | Source: Ambulatory Visit | Attending: Gastroenterology

## 2016-11-04 ENCOUNTER — Encounter (HOSPITAL_COMMUNITY): Payer: Self-pay | Admitting: *Deleted

## 2016-11-04 DIAGNOSIS — D123 Benign neoplasm of transverse colon: Secondary | ICD-10-CM | POA: Diagnosis not present

## 2016-11-04 DIAGNOSIS — Z7982 Long term (current) use of aspirin: Secondary | ICD-10-CM | POA: Diagnosis not present

## 2016-11-04 DIAGNOSIS — I10 Essential (primary) hypertension: Secondary | ICD-10-CM | POA: Insufficient documentation

## 2016-11-04 DIAGNOSIS — J45909 Unspecified asthma, uncomplicated: Secondary | ICD-10-CM | POA: Insufficient documentation

## 2016-11-04 DIAGNOSIS — E039 Hypothyroidism, unspecified: Secondary | ICD-10-CM | POA: Diagnosis not present

## 2016-11-04 DIAGNOSIS — Z1211 Encounter for screening for malignant neoplasm of colon: Secondary | ICD-10-CM | POA: Diagnosis present

## 2016-11-04 DIAGNOSIS — Z79899 Other long term (current) drug therapy: Secondary | ICD-10-CM | POA: Insufficient documentation

## 2016-11-04 HISTORY — PX: COLONOSCOPY WITH PROPOFOL: SHX5780

## 2016-11-04 SURGERY — COLONOSCOPY WITH PROPOFOL
Anesthesia: Monitor Anesthesia Care

## 2016-11-04 MED ORDER — LACTATED RINGERS IV SOLN
INTRAVENOUS | Status: DC
  Administered 2016-11-04: 13:00:00 via INTRAVENOUS

## 2016-11-04 MED ORDER — PROPOFOL 10 MG/ML IV BOLUS
INTRAVENOUS | Status: DC | PRN
  Administered 2016-11-04: 20 mg via INTRAVENOUS

## 2016-11-04 MED ORDER — PROPOFOL 500 MG/50ML IV EMUL
INTRAVENOUS | Status: DC | PRN
Start: 2016-11-04 — End: 2016-11-04
  Administered 2016-11-04: 125 ug/kg/min via INTRAVENOUS

## 2016-11-04 MED ORDER — SODIUM CHLORIDE 0.9 % IV SOLN: INTRAVENOUS | Status: DC

## 2016-11-04 MED ORDER — ONDANSETRON HCL 4 MG/2ML IJ SOLN
INTRAMUSCULAR | Status: DC | PRN
  Administered 2016-11-04: 4 mg via INTRAVENOUS

## 2016-11-04 MED ORDER — PROPOFOL 10 MG/ML IV BOLUS
INTRAVENOUS | Status: AC
  Filled 2016-11-04: qty 40

## 2016-11-04 SURGICAL SUPPLY — 21 items

## 2016-11-04 NOTE — Op Note (Signed)
[facility] Patient Name: Tamara Rosales Procedure Date: 11/04/2016 MRN: [id number] Attending MD: Garlan Fair , MD Date of Birth: Dec 15, 1955 CSN: [id number] Age: 61 Admit Type: Outpatient Procedure:                Colonoscopy Indications:              Screening for colorectal malignant neoplasm Providers:                Garlan Fair, MD, Kingsley Plan, RN, Lillie Fragmin, RN, Cherylynn Ridges, Technician, Christell Faith, CRNA Referring MD:              Medicines:                Propofol per Anesthesia Complications:            No immediate complications. Estimated Blood Loss:     Estimated blood loss was minimal. Procedure:                Pre-Anesthesia Assessment:                           - Prior to the procedure, a History and Physical                            was performed, and patient medications and                            allergies were reviewed. The patient's tolerance of                            previous anesthesia was also reviewed. The risks                            and benefits of the procedure and the sedation                            options and risks were discussed with the patient.                            All questions were answered, and informed consent                            was obtained. Prior Anticoagulants: The patient has                            taken aspirin, last dose was 1 day prior to                            procedure. ASA Grade Assessment: II - A patient  with mild systemic disease. After reviewing the                            risks and benefits, the patient was deemed in                            satisfactory condition to undergo the procedure.                           After obtaining informed consent, the colonoscope                            was passed under direct vision. Throughout the                            procedure,  the patient's blood pressure, pulse, and                            oxygen saturations were monitored continuously. The                            EC-[id number] ([id number]) scope was introduced through                            the anus and advanced to the the cecum, identified                            by appendiceal orifice and ileocecal valve. The                            colonoscopy was performed without difficulty. The                            patient tolerated the procedure well. The quality                            of the bowel preparation was good. The terminal                            ileum, the ileocecal valve, the appendiceal orifice                            and the rectum were photographed. Findings:      The perianal and digital rectal examinations were normal.      A 5 mm polyp was found in the transverse colon. The polyp was sessile.       The polyp was removed with a cold snare. Resection and retrieval were       complete.      Two sessile polyps were found in the transverse colon. The polyps were 3       mm in size. These polyps were removed with a cold biopsy forceps.       Resection and retrieval were complete.      The exam was otherwise without abnormality. Impression:               -  One 5 mm polyp in the transverse colon, removed                            with a cold snare. Resected and retrieved.                           - Two 3 mm polyps in the transverse colon, removed                            with a cold biopsy forceps. Resected and retrieved.                           - The examination was otherwise normal. Moderate Sedation:      N/A- Per Anesthesia Care Recommendation:           - Patient has a contact number available for                            emergencies. The signs and symptoms of potential                            delayed complications were discussed with the                            patient. Return to normal activities tomorrow.                             Written discharge instructions were provided to the                            patient.                           - Repeat colonoscopy date to be determined after                            pending pathology results are reviewed for                            surveillance.                           - Resume previous diet.                           - Continue present medications. Procedure Code(s):        --- Professional ---                           [phone], Colonoscopy, flexible; with removal of                            tumor(s), polyp(s), or other lesion(s) by snare                            technique  21224, 66, Colonoscopy, flexible; with biopsy,                            single or multiple Diagnosis Code(s):        --- Professional ---                           D12.3, Benign neoplasm of transverse colon (hepatic                            flexure or splenic flexure)                           Z12.11, Encounter for screening for malignant                            neoplasm of colon CPT copyright 2016 American Medical Association. All rights reserved. The codes documented in this report are preliminary and upon coder review may  be revised to meet current compliance requirements. Earle Gell, MD Garlan Fair, MD 11/04/2016 1:51:37 PM This report has been signed electronically. Number of Addenda: 0

## 2016-11-04 NOTE — Anesthesia Preprocedure Evaluation (Signed)
Anesthesia Evaluation  Patient identified by MRN, date of birth, ID band Patient awake    Reviewed: Allergy & Precautions, NPO status , Patient's Chart, lab work & pertinent test results  Airway Mallampati: II  TM Distance: >3 FB Neck ROM: Full    Dental no notable dental hx.    Pulmonary asthma ,    Pulmonary exam normal breath sounds clear to auscultation       Cardiovascular hypertension, Pt. on medications Normal cardiovascular exam Rhythm:Regular Rate:Normal     Neuro/Psych negative neurological ROS  negative psych ROS   GI/Hepatic negative GI ROS, Neg liver ROS,   Endo/Other  Hypothyroidism   Renal/GU negative Renal ROS  negative genitourinary   Musculoskeletal negative musculoskeletal ROS (+)   Abdominal   Peds negative pediatric ROS (+)  Hematology negative hematology ROS (+)   Anesthesia Other Findings   Reproductive/Obstetrics negative OB ROS                             Anesthesia Physical Anesthesia Plan  ASA: II  Anesthesia Plan: MAC   Post-op Pain Management:    Induction: Intravenous  PONV Risk Score and Plan: 0  Airway Management Planned: Simple Face Mask  Additional Equipment:   Intra-op Plan:   Post-operative Plan:   Informed Consent: I have reviewed the patients History and Physical, chart, labs and discussed the procedure including the risks, benefits and alternatives for the proposed anesthesia with the patient or authorized representative who has indicated his/her understanding and acceptance.   Dental advisory given  Plan Discussed with: CRNA  Anesthesia Plan Comments:         Anesthesia Quick Evaluation

## 2016-11-04 NOTE — Discharge Instructions (Signed)

## 2016-11-04 NOTE — H&P (Signed)
Procedure: Screening colonoscopy. 05/05/2002 normal screening colonoscopy was performed  History: The patient is a 61 year old female born [birth year]. She is scheduled to undergo a repeat screening colonoscopy today.  Past medical history: Hypertension. Hypothyroidism. Asthma. Migraine headache syndrome. Tonsillectomy. Ovarian cyst surgery. Cesarean section. Hysterectomy. Ankle surgery. Lumbar disc surgery.  Exam: The patient is alert and lying comfortably on the endoscopy stretcher. Abdomen is soft and nontender to palpation. Cardiac exam reveals a regular rhythm. Lungs clear to auscultation.  Plan: Proceed with screening colonoscopy

## 2016-11-04 NOTE — Transfer of Care (Signed)
Immediate Anesthesia Transfer of Care Note  Patient: Tamara Rosales  Procedure(s) Performed: Procedure(s): COLONOSCOPY WITH PROPOFOL (N/A)  Patient Location: PACU  Anesthesia Type:MAC  Level of Consciousness:  sedated, patient cooperative and responds to stimulation  Airway & Oxygen Therapy:Patient Spontanous Breathing and Patient connected to face mask oxgen  Post-op Assessment:  Report given to PACU RN and Post -op Vital signs reviewed and stable  Post vital signs:  Reviewed and stable  Last Vitals:  Vitals:   11/04/16 1254  BP: (!) 158/97  Pulse: 87  Resp: 19  Temp: 92.4 C    Complications: No apparent anesthesia complications

## 2016-11-05 ENCOUNTER — Other Ambulatory Visit: Payer: Self-pay

## 2016-11-05 ENCOUNTER — Encounter (HOSPITAL_COMMUNITY): Payer: Self-pay | Admitting: Gastroenterology

## 2016-11-05 NOTE — Anesthesia Postprocedure Evaluation (Signed)
Anesthesia Post Note  Patient: Tamara Rosales  Procedure(s) Performed: Procedure(s) (LRB): COLONOSCOPY WITH PROPOFOL (N/A)     Patient location during evaluation: Endoscopy Anesthesia Type: MAC Level of consciousness: awake and alert Pain management: pain level controlled Vital Signs Assessment: post-procedure vital signs reviewed and stable Respiratory status: spontaneous breathing, nonlabored ventilation, respiratory function stable and patient connected to nasal cannula oxygen Cardiovascular status: stable and blood pressure returned to baseline Anesthetic complications: no    Last Vitals:  Vitals:   11/04/16 1254 11/04/16 1351  BP: (!) 158/97 132/80  Pulse: 87 81  Resp: 19 13  Temp: 36.8 C 36.6 C    Last Pain:  Vitals:   11/04/16 1351  TempSrc: Oral                 Montez Hageman

## 2016-12-08 ENCOUNTER — Other Ambulatory Visit: Payer: Self-pay

## 2016-12-10 ENCOUNTER — Other Ambulatory Visit: Payer: BC Managed Care – PPO

## 2016-12-10 DIAGNOSIS — E039 Hypothyroidism, unspecified: Secondary | ICD-10-CM

## 2016-12-11 LAB — TSH: TSH: 0.86 mIU/L

## 2016-12-17 ENCOUNTER — Other Ambulatory Visit: Payer: Self-pay | Admitting: Family Medicine

## 2016-12-17 DIAGNOSIS — E039 Hypothyroidism, unspecified: Secondary | ICD-10-CM

## 2017-02-25 ENCOUNTER — Telehealth: Payer: Self-pay | Admitting: Family Medicine

## 2017-02-25 NOTE — Telephone Encounter (Signed)
FFO (please stamp address)

## 2017-02-25 NOTE — Telephone Encounter (Signed)
Pt dropped off form you put in your folder would like it mailed back to her she wrote a letter to you about the form pt can be reached at [phone]

## 2017-02-26 ENCOUNTER — Telehealth: Payer: Self-pay | Admitting: Family Medicine

## 2017-02-26 NOTE — Telephone Encounter (Signed)
Called pt and I  informed form ready and she asked if you felt comfortable adding a note or comment on the form that you do not feel that this review is necessary anymore, please advise, form back in your folder

## 2017-02-26 NOTE — Telephone Encounter (Signed)
I believe there were questions on the form asking this, and I already answered that this wasn't needed in future

## 2017-02-26 NOTE — Telephone Encounter (Signed)
Ok form copied and mailed to pt

## 2017-03-19 ENCOUNTER — Other Ambulatory Visit: Payer: Self-pay | Admitting: Family Medicine

## 2017-03-19 DIAGNOSIS — E039 Hypothyroidism, unspecified: Secondary | ICD-10-CM

## 2017-03-25 ENCOUNTER — Other Ambulatory Visit: Payer: Self-pay | Admitting: Family Medicine

## 2017-03-25 DIAGNOSIS — I10 Essential (primary) hypertension: Secondary | ICD-10-CM

## 2017-04-02 ENCOUNTER — Encounter: Payer: Self-pay | Admitting: *Deleted
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
## 2017-05-07 ENCOUNTER — Encounter: Payer: Self-pay | Admitting: Family Medicine

## 2017-05-07 ENCOUNTER — Ambulatory Visit: Payer: BC Managed Care – PPO | Admitting: Family Medicine

## 2017-05-07 VITALS — BP 140/100 | HR 80 | Ht 64.0 in | Wt 213.6 lb

## 2017-05-07 DIAGNOSIS — J45998 Other asthma: Secondary | ICD-10-CM

## 2017-05-07 DIAGNOSIS — J302 Other seasonal allergic rhinitis: Secondary | ICD-10-CM

## 2017-05-07 DIAGNOSIS — Z0000 Encounter for general adult medical examination without abnormal findings: Secondary | ICD-10-CM

## 2017-05-07 DIAGNOSIS — I10 Essential (primary) hypertension: Secondary | ICD-10-CM | POA: Diagnosis not present

## 2017-05-07 DIAGNOSIS — E039 Hypothyroidism, unspecified: Secondary | ICD-10-CM | POA: Diagnosis not present

## 2017-05-07 DIAGNOSIS — E559 Vitamin D deficiency, unspecified: Secondary | ICD-10-CM

## 2017-05-07 LAB — POCT URINALYSIS DIP (PROADVANTAGE DEVICE)
Bilirubin, UA: NEGATIVE
Blood, UA: NEGATIVE
Glucose, UA: NEGATIVE mg/dL
Ketones, POC UA: NEGATIVE mg/dL
Leukocytes, UA: NEGATIVE
Nitrite, UA: NEGATIVE
Protein Ur, POC: NEGATIVE mg/dL
Specific Gravity, Urine: 1.02
Urobilinogen, Ur: NEGATIVE
pH, UA: 6 (ref 5.0–8.0)

## 2017-05-07 MED ORDER — ALBUTEROL SULFATE (2.5 MG/3ML) 0.083% IN NEBU: 2.5000 mg | INHALATION_SOLUTION | Freq: Four times a day (QID) | RESPIRATORY_TRACT | 0 refills | Status: DC | PRN

## 2017-05-07 MED ORDER — ALBUTEROL SULFATE HFA 108 (90 BASE) MCG/ACT IN AERS: 2.0000 | INHALATION_SPRAY | Freq: Four times a day (QID) | RESPIRATORY_TRACT | 1 refills | Status: DC | PRN

## 2017-05-07 NOTE — Patient Instructions (Addendum)
HEALTH MAINTENANCE RECOMMENDATIONS:  It is recommended that you get at least 30 minutes of aerobic exercise at least 5 days/week (for weight loss, you may need as much as 60-90 minutes). This can be any activity that gets your heart rate up. This can be divided in 10-15 minute intervals if needed, but try and build up your endurance at least once a week.  Weight bearing exercise is also recommended twice weekly.  Eat a healthy diet with lots of vegetables, fruits and fiber.  "Colorful" foods have a lot of vitamins (ie green vegetables, tomatoes, red peppers, etc).  Limit sweet tea, regular sodas and alcoholic beverages, all of which has a lot of calories and sugar.  Up to 1 alcoholic drink daily may be beneficial for women (unless trying to lose weight, watch sugars).  Drink a lot of water.  Calcium recommendations are 1200-1500 mg daily (1500 mg for postmenopausal women or women without ovaries), and vitamin D 1000 IU daily.  This should be obtained from diet and/or supplements (vitamins), and calcium should not be taken all at once, but in divided doses.  Monthly self breast exams and yearly mammograms for women over the age of 59 is recommended.  Sunscreen of at least SPF 30 should be used on all sun-exposed parts of the skin when outside between the hours of 10 am and 4 pm (not just when at beach or pool, but even with exercise, golf, tennis, and yard work!)  Use a sunscreen that says "broad spectrum" so it covers both UVA and UVB rays, and make sure to reapply every 1-2 hours.  Remember to change the batteries in your smoke detectors when changing your clock times in the spring and fall.  Use your seat belt every time you are in a car, and please drive safely and not be distracted with cell phones and texting while driving.  I recommend getting the new shingles vaccine (Shingrix). You will need to check with your insurance to see if it is covered, and if covered by Medicare Part D, you need to  get from the pharmacy rather than our office.  It is a series of 2 injections, spaced 2 months apart.   Your blood pressure was high. Try and monitor it regularly at home. Resume regular exercise/walking. Be sure to limit the sodium in your diet. Return in 1 month with a list of your blood pressures (feel free to bring your monitor to have the accuracy of it checked).   Restart daily allegra, and continue daily flonase. If your allergy symptoms and wheezing aren't improving, then we should add daily singulair.  Call within a week if you are still needing daily albuterol despite using both allergy medications. (call before 12/13 for the prescription) If we start singulair, this is a DAILY PREVENTATIVE medication (not just as needed).    Low-Sodium Eating Plan Sodium, which is an element that makes up salt, helps you maintain a healthy balance of fluids in your body. Too much sodium can increase your blood pressure and cause fluid and waste to be held in your body. Your health care provider or dietitian may recommend following this plan if you have high blood pressure (hypertension), kidney disease, liver disease, or heart failure. Eating less sodium can help lower your blood pressure, reduce swelling, and protect your heart, liver, and kidneys. What are tips for following this plan? General guidelines  Most people on this plan should limit their sodium intake to 1,500-2,000 mg (milligrams) of sodium  each day. Reading food labels  The Nutrition Facts label lists the amount of sodium in one serving of the food. If you eat more than one serving, you must multiply the listed amount of sodium by the number of servings.  Choose foods with less than 140 mg of sodium per serving.  Avoid foods with 300 mg of sodium or more per serving. Shopping  Look for lower-sodium products, often labeled as "low-sodium" or "no salt added."  Always check the sodium content even if foods are labeled as  "unsalted" or "no salt added".  Buy fresh foods. ? Avoid canned foods and premade or frozen meals. ? Avoid canned, cured, or processed meats  Buy breads that have less than 80 mg of sodium per slice. Cooking  Eat more home-cooked food and less restaurant, buffet, and fast food.  Avoid adding salt when cooking. Use salt-free seasonings or herbs instead of table salt or sea salt. Check with your health care provider or pharmacist before using salt substitutes.  Cook with plant-based oils, such as canola, sunflower, or olive oil. Meal planning  When eating at a restaurant, ask that your food be prepared with less salt or no salt, if possible.  Avoid foods that contain MSG (monosodium glutamate). MSG is sometimes added to [place] food, bouillon, and some canned foods. What foods are recommended? The items listed may not be a complete list. Talk with your dietitian about what dietary choices are best for you. Grains Low-sodium cereals, including oats, puffed wheat and rice, and shredded wheat. Low-sodium crackers. Unsalted rice. Unsalted pasta. Low-sodium bread. Whole-grain breads and whole-grain pasta. Vegetables Fresh or frozen vegetables. "No salt added" canned vegetables. "No salt added" tomato sauce and paste. Low-sodium or reduced-sodium tomato and vegetable juice. Fruits Fresh, frozen, or canned fruit. Fruit juice. Meats and other protein foods Fresh or frozen (no salt added) meat, poultry, seafood, and fish. Low-sodium canned tuna and salmon. Unsalted nuts. Dried peas, beans, and lentils without added salt. Unsalted canned beans. Eggs. Unsalted nut butters. Dairy Milk. Soy milk. Cheese that is naturally low in sodium, such as ricotta cheese, fresh mozzarella, or Swiss cheese Low-sodium or reduced-sodium cheese. Cream cheese. Yogurt. Fats and oils Unsalted butter. Unsalted margarine with no trans fat. Vegetable oils such as canola or olive oils. Seasonings and other foods Fresh  and dried herbs and spices. Salt-free seasonings. Low-sodium mustard and ketchup. Sodium-free salad dressing. Sodium-free light mayonnaise. Fresh or refrigerated horseradish. Lemon juice. Vinegar. Homemade, reduced-sodium, or low-sodium soups. Unsalted popcorn and pretzels. Low-salt or salt-free chips. What foods are not recommended? The items listed may not be a complete list. Talk with your dietitian about what dietary choices are best for you. Grains Instant hot cereals. Bread stuffing, pancake, and biscuit mixes. Croutons. Seasoned rice or pasta mixes. Noodle soup cups. Boxed or frozen macaroni and cheese. Regular salted crackers. Self-rising flour. Vegetables Sauerkraut, pickled vegetables, and relishes. Olives. [place] fries. Onion rings. Regular canned vegetables (not low-sodium or reduced-sodium). Regular canned tomato sauce and paste (not low-sodium or reduced-sodium). Regular tomato and vegetable juice (not low-sodium or reduced-sodium). Frozen vegetables in sauces. Meats and other protein foods Meat or fish that is salted, canned, smoked, spiced, or pickled. Bacon, ham, sausage, hotdogs, corned beef, chipped beef, packaged lunch meats, salt pork, jerky, pickled herring, anchovies, regular canned tuna, sardines, salted nuts. Dairy Processed cheese and cheese spreads. Cheese curds. Blue cheese. Feta cheese. String cheese. Regular cottage cheese. Buttermilk. Canned milk. Fats and oils Salted butter. Regular margarine. Ghee.  Bacon fat. Seasonings and other foods Onion salt, garlic salt, seasoned salt, table salt, and sea salt. Canned and packaged gravies. Worcestershire sauce. Tartar sauce. Barbecue sauce. Teriyaki sauce. Soy sauce, including reduced-sodium. Steak sauce. Fish sauce. Oyster sauce. Cocktail sauce. Horseradish that you find on the shelf. Regular ketchup and mustard. Meat flavorings and tenderizers. Bouillon cubes. Hot sauce and Tabasco sauce. Premade or packaged marinades. Premade  or packaged taco seasonings. Relishes. Regular salad dressings. Salsa. Potato and tortilla chips. Corn chips and puffs. Salted popcorn and pretzels. Canned or dried soups. Pizza. Frozen entrees and pot pies. Summary  Eating less sodium can help lower your blood pressure, reduce swelling, and protect your heart, liver, and kidneys.  Most people on this plan should limit their sodium intake to 1,500-2,000 mg (milligrams) of sodium each day.  Canned, boxed, and frozen foods are high in sodium. Restaurant foods, fast foods, and pizza are also very high in sodium. You also get sodium by adding salt to food.  Try to cook at home, eat more fresh fruits and vegetables, and eat less fast food, canned, processed, or prepared foods. This information is not intended to replace advice given to you by your health care provider. Make sure you discuss any questions you have with your health care provider. Document Released: 11/08/2001 Document Revised: 05/12/2016 Document Reviewed: 05/12/2016 Elsevier Interactive Patient Education  2017 [employer].

## 2017-05-08 LAB — COMPREHENSIVE METABOLIC PANEL
AG Ratio: 1.3 (calc) (ref 1.0–2.5)
ALT: 21 U/L (ref 6–29)
AST: 16 U/L (ref 10–35)
Albumin: 4.2 g/dL (ref 3.6–5.1)
Alkaline phosphatase (APISO): 51 U/L (ref 33–130)
BUN: 25 mg/dL (ref 7–25)
CO2: 30 mmol/L (ref 20–32)
Calcium: 10 mg/dL (ref 8.6–10.4)
Chloride: 100 mmol/L (ref 98–110)
Creat: 0.65 mg/dL (ref 0.50–0.99)
Globulin: 3.3 g/dL (calc) (ref 1.9–3.7)
Glucose, Bld: 95 mg/dL (ref 65–99)
Potassium: 4.4 mmol/L (ref 3.5–5.3)
Sodium: 140 mmol/L (ref 135–146)
Total Bilirubin: 0.4 mg/dL (ref 0.2–1.2)
Total Protein: 7.5 g/dL (ref 6.1–8.1)

## 2017-05-08 LAB — CBC WITH DIFFERENTIAL/PLATELET
Basophils Absolute: 62 cells/uL (ref 0–200)
Basophils Relative: 0.8 %
Eosinophils Absolute: 323 cells/uL (ref 15–500)
Eosinophils Relative: 4.2 %
HCT: 43.3 % (ref 35.0–45.0)
Hemoglobin: 14.9 g/dL (ref 11.7–15.5)
Lymphs Abs: 2995 cells/uL (ref 850–3900)
MCH: 33.3 pg — ABNORMAL HIGH (ref 27.0–33.0)
MCHC: 34.4 g/dL (ref 32.0–36.0)
MCV: 96.7 fL (ref 80.0–100.0)
MPV: 9.5 fL (ref 7.5–12.5)
Monocytes Relative: 6 %
Neutro Abs: 3858 cells/uL (ref 1500–7800)
Neutrophils Relative %: 50.1 %
Platelets: 336 10*3/uL (ref 140–400)
RBC: 4.48 10*6/uL (ref 3.80–5.10)
RDW: 11.7 % (ref 11.0–15.0)
Total Lymphocyte: 38.9 %
WBC mixed population: 462 cells/uL (ref 200–950)
WBC: 7.7 10*3/uL (ref 3.8–10.8)

## 2017-05-08 LAB — LIPID PANEL
Cholesterol: 181 mg/dL (ref <200)
HDL: 40 mg/dL — ABNORMAL LOW (ref >50)
LDL Cholesterol (Calc): 111 mg/dL (calc) — ABNORMAL HIGH
Non-HDL Cholesterol (Calc): 141 mg/dL (calc) — ABNORMAL HIGH (ref <130)
Total CHOL/HDL Ratio: 4.5 (calc) (ref <5.0)
Triglycerides: 189 mg/dL — ABNORMAL HIGH (ref <150)

## 2017-05-08 LAB — VITAMIN D 25 HYDROXY (VIT D DEFICIENCY, FRACTURES): Vit D, 25-Hydroxy: 35 ng/mL (ref 30–100)

## 2017-05-08 LAB — TSH: TSH: 3.71 mIU/L (ref 0.40–4.50)

## 2017-06-08 ENCOUNTER — Encounter: Payer: BC Managed Care – PPO | Admitting: Family Medicine

## 2017-06-23 ENCOUNTER — Telehealth: Payer: Self-pay | Admitting: Family Medicine

## 2017-06-23 ENCOUNTER — Other Ambulatory Visit: Payer: Self-pay | Admitting: Family Medicine

## 2017-06-23 DIAGNOSIS — I10 Essential (primary) hypertension: Secondary | ICD-10-CM

## 2017-06-23 DIAGNOSIS — E039 Hypothyroidism, unspecified: Secondary | ICD-10-CM

## 2017-06-23 MED ORDER — LISINOPRIL-HYDROCHLOROTHIAZIDE 20-12.5 MG PO TABS: 2.0000 | ORAL_TABLET | Freq: Every day | ORAL | 0 refills | Status: DC

## 2017-06-23 NOTE — Telephone Encounter (Signed)
Will send and she needed her thyroid meds too.

## 2017-06-23 NOTE — Telephone Encounter (Signed)
Pt called and will be out of bp meds before her visit on Monday. She is requesting refills of bp med. Please send to [facility] in [place]. Pt can be reached at [phone].

## 2017-06-27 NOTE — Progress Notes (Signed)
Chief Complaint  Patient presents with  . Hypertension    Patient presents for f/u on hypertension.  Blood pressure was elevated when she was here for her physical last month.  She was instructed on low sodium diet, encouraged to monitor blood pressure, and return with list and monitor (to verify the accuracy).  She forgot to bring her list. She recalls that a few of the readings were good, 110-120/70, but most are running 120-130/90, sometimes 88.  She doesn't eat canned foods, little processed foods, doesn't add. Sometimes eats black olives. She has been avoiding [place] food since her last visit.  She has gained weight. She is walking daily.  She denies headaches, dizziness, chest pain, palpitations. Reports compliance with medications and denies side effects.  She was also noted to have low HDL and elevated TG on her last labs. Lab Results  Component Value Date   CHOL 181 05/07/2017   HDL 40 (L) 05/07/2017   LDLCALC 82 12/19/2015   TRIG 189 (H) 05/07/2017   CHOLHDL 4.5 05/07/2017  LDL was 111 in 05/2017   Hypothyroidism:  Euthyroid per last TSH, denies symptoms, and reports compliance with her medications. Skin is dry, but often happens in the winter. She has noticed some more hair loss over the last 2 months, and weight gain. She isn't sure if she takes generic or brand (should be getting branded). Takes med when she wakes up at [employer], takes other medications at lunch. Lab Results  Component Value Date   TSH 3.71 05/07/2017   6# weight gain in the last 6 weeks.   PMH, PSH, SH reviewed  Outpatient Encounter Medications as of 06/29/2017  Medication Sig Note  . aspirin EC 81 MG tablet Take 81 mg by mouth daily.   . Calcium Carbonate-Vitamin D (CALCIUM-VITAMIN D) 500-200 MG-UNIT per tablet Take 1 tablet by mouth daily.    . fexofenadine (ALLEGRA) 180 MG tablet Take 180 mg by mouth daily. Reported on 12/19/2015   . lisinopril-hydrochlorothiazide (PRINZIDE,ZESTORETIC) 20-12.5  MG tablet Take 2 tablets by mouth daily.   . Multiple Vitamins-Minerals (MULTIVITAMIN WITH MINERALS) tablet Take 1 tablet by mouth daily.   . Omega-3 Fatty Acids (FISH OIL PO) Take 1 capsule by mouth daily.  05/07/2017: Takes 2 daily  . SYNTHROID 137 MCG tablet TAKE 1 TABLET BY MOUTH ONCE DAILY BEFORE BREAKFAST   . albuterol (PROVENTIL HFA;VENTOLIN HFA) 108 (90 Base) MCG/ACT inhaler Inhale 2 puffs into the lungs every 6 (six) hours as needed for wheezing or shortness of breath. Reported on 12/19/2015 (Patient not taking: Reported on 06/29/2017)   . albuterol (PROVENTIL) (2.5 MG/3ML) 0.083% nebulizer solution Take 3 mLs (2.5 mg total) by nebulization every 6 (six) hours as needed for wheezing or shortness of breath. (Patient not taking: Reported on 06/29/2017)   . nebivolol (BYSTOLIC) 5 MG tablet Take 1 tablet (5 mg total) by mouth daily.   . [DISCONTINUED] traMADol (ULTRAM) 50 MG tablet Take 1-2 tablets (50-100 mg total) by mouth every 6 (six) hours as needed. (Patient not taking: Reported on 10/17/2016)    No facility-administered encounter medications on file as of 3/71/0626.    (bysolic started today, not taking prior to visit)  No Known Allergies  ROS:  +hair loss, weight gain. No headaches, dizziness, chest pain, fever, chills, URI symptoms, cough, shortness of breath, edema, GI or GU complaints. See HPI.   PHYSICAL EXAM:  BP 136/86   Pulse 92   Ht 5\' 4"  (1.626 m)   Wt 219  lb 3.2 oz (99.4 kg)   BMI 37.63 kg/m    138/100 on repeat by MD, pulse also 90-100  Wt Readings from Last 3 Encounters:  06/29/17 219 lb 3.2 oz (99.4 kg)  05/07/17 213 lb 9.6 oz (96.9 kg)  11/04/16 208 lb (94.3 kg)   Well appearing, pleasant female in no distress HEENT: PERRL,EOMI, conjunctiva and sclera are clear Neck: no lymphadenopathy, thyromegaly or bruit Heart: regular rate and rhythm Lungs: clear bilaterally Back: no CVA or spinal tenderness Abdomen: soft, nontender, no mas Extremities: no edema,  normal pulses Psych: normal mood, affect, hygiene and grooming Neuro: alert and oriented, cranial nerves intact, normal gait.    ASSESSMENT/PLAN:  Essential hypertension, benign - diastolics remain elevated; add bystolic to regimen. f/u 4-6 weeks with list of BP's - Plan: nebivolol (BYSTOLIC) 5 MG tablet  Hypothyroidism, unspecified type - discussed generic vs brand (should be branded); rec recheck TSH if persistent hair loss, weight gain. Taking med properly, continue current dose for now  Dyslipidemia - reviewed lowfat, low cholesterol diet  F/u 6-8 weeks on HTN, with BP log.    Start Bystolic 5mg  tablets in addition to your current lisinopril HCT (at the same dose).  You might want to start at 1/2 tablet (2.5mg ), and increase to the full pill after a week if your blood pressure is still >92 systolic.  Goal is <130/80. Be sure to bring your list of blood pressures when you come next time. And be sure to write "comments".  Contact us when you need this sent in to the pharmacy.  Go to [email or web address] to see if there are coupons to save money.  Please check with the pharmacy to make sure that you are getting the branded medication, and not a generic. If getting a generic (and have always been) then make sure they didn't change the manufacturer who made the medication, as that can effect the dose.  If your thyroid symptoms do not improve in the next 1-2 months (dry skin, hair loss, weight gain), then return here for recheck of your thyroid.  Be sure that you separate your thyroid medication from your multivitamin and calcium pill by at least 4 hours (can be separated from blood pressure medication by just 30-60 minutes.

## 2017-06-29 ENCOUNTER — Ambulatory Visit: Payer: BC Managed Care – PPO | Admitting: Family Medicine

## 2017-06-29 ENCOUNTER — Encounter: Payer: Self-pay | Admitting: Family Medicine

## 2017-06-29 VITALS — BP 136/86 | HR 92 | Ht 64.0 in | Wt 219.2 lb

## 2017-06-29 DIAGNOSIS — E785 Hyperlipidemia, unspecified: Secondary | ICD-10-CM

## 2017-06-29 DIAGNOSIS — I10 Essential (primary) hypertension: Secondary | ICD-10-CM

## 2017-06-29 DIAGNOSIS — E039 Hypothyroidism, unspecified: Secondary | ICD-10-CM

## 2017-06-29 MED ORDER — NEBIVOLOL HCL 5 MG PO TABS: 5.0000 mg | ORAL_TABLET | Freq: Every day | ORAL | 0 refills | Status: DC

## 2017-06-29 NOTE — Patient Instructions (Signed)
  Start Bystolic 5mg  tablets in addition to your current lisinopril HCT (at the same dose).  You might want to start at 1/2 tablet (2.5mg ), and increase to the full pill after a week if your blood pressure is still >79 systolic.  Goal is <130/80. Be sure to bring your list of blood pressures when you come next time. And be sure to write "comments".  Contact us when you need this sent in to the pharmacy.  Go to [email or web address] to see if there are coupons to save money.  Please check with the pharmacy to make sure that you are getting the branded medication, and not a generic. If getting a generic (and have always been) then make sure they didn't change the manufacturer who made the medication, as that can effect the dose.  If your thyroid symptoms do not improve in the next 1-2 months (dry skin, hair loss, weight gain), then return here for recheck of your thyroid.  Be sure that you separate your thyroid medication from your multivitamin and calcium pill by at least 4 hours (can be separated from blood pressure medication by just 30-60 minutes.

## 2017-06-30 ENCOUNTER — Telehealth: Payer: Self-pay | Admitting: Family Medicine

## 2017-06-30 NOTE — Telephone Encounter (Signed)
Yes, if she is unable to cut the tablets, then start at full pill. She likely will do fine with the full pill.   If her blood pressure drops and she feels dizzy, then she can either buy a pill cutter (which makes it easier to cut) or get a prescription filled for the 2.5mg  dose (I don't recall seeing samples of that dose)

## 2017-06-30 NOTE — Telephone Encounter (Signed)
Pt said she was given Bystolic samples yesterday and told to cut the tablet is half to take 1/2 a tablet however each time she attempts to cut the tablet in half, the tablet crumbles. What should she do? Should she take the entire tablet?

## 2017-06-30 NOTE — Telephone Encounter (Signed)
Left message advising pt of Dr Johnsie Kindred instructions

## 2017-07-27 ENCOUNTER — Other Ambulatory Visit: Payer: Self-pay | Admitting: Family Medicine

## 2017-07-27 DIAGNOSIS — I10 Essential (primary) hypertension: Secondary | ICD-10-CM

## 2017-07-28 MED ORDER — NEBIVOLOL HCL 5 MG PO TABS: 5.0000 mg | ORAL_TABLET | Freq: Every day | ORAL | 0 refills | Status: DC

## 2017-07-29 ENCOUNTER — Other Ambulatory Visit: Payer: Self-pay | Admitting: *Deleted

## 2017-07-29 ENCOUNTER — Other Ambulatory Visit: Payer: Self-pay | Admitting: Family Medicine

## 2017-07-29 DIAGNOSIS — I10 Essential (primary) hypertension: Secondary | ICD-10-CM

## 2017-07-29 DIAGNOSIS — Z1231 Encounter for screening mammogram for malignant neoplasm of breast: Secondary | ICD-10-CM

## 2017-07-29 MED ORDER — NEBIVOLOL HCL 5 MG PO TABS: 5.0000 mg | ORAL_TABLET | Freq: Every day | ORAL | 0 refills | Status: DC

## 2017-08-07 ENCOUNTER — Ambulatory Visit
Admission: RE | Admit: 2017-08-07 | Discharge: 2017-08-07 | Disposition: A | Discharge status: Home / Self Care | Payer: BC Managed Care – PPO | Source: Ambulatory Visit | Attending: Family Medicine | Admitting: Family Medicine

## 2017-08-07 DIAGNOSIS — Z1231 Encounter for screening mammogram for malignant neoplasm of breast: Secondary | ICD-10-CM

## 2017-08-16 NOTE — Progress Notes (Signed)
Chief Complaint  Patient presents with  . Hypertension    6 week follow up. As long as meds are not being changed she will need Bystolic samples as she is going out of town and it's too early to refill rx.     Patient presents for 6 week f/u on hypertension.  Bystolic 5mg  was added to her lisinopril HCT due to blood pressures not being at goal. She cut out olives.  Still eats some [place] food. Tries to limit the salt in her diet. Walking 3-4x/week, rather than daily, has been busy. Trying to find balance between helping others and caring for herself. Today she forgot her list of blood pressures. She was at [place] last week (granddaughter in hospital), and nurses at [facility] checked it last week, and also checking at home. She recalls BP's have been max 130/82, usually running 120's/70's.  Denies headaches, dizziness, syncope.  Felt slightly lightheaded a couple of days, short-lived. Denies chest pain, palpitations.  While the baby was back in the hospital last week, she didn't get as much exercise.  Also, her daughter had surgery last week, will be staying with her. Supposed to go to [facility] next week. Debating about whether she can go.  PMH, PSH, SH reviewed  Outpatient Encounter Medications as of 08/17/2017  Medication Sig Note  . aspirin EC 81 MG tablet Take 81 mg by mouth daily.   . Calcium Carbonate-Vitamin D (CALCIUM-VITAMIN D) 500-200 MG-UNIT per tablet Take 1 tablet by mouth daily.    . fexofenadine (ALLEGRA) 180 MG tablet Take 180 mg by mouth daily. Reported on 12/19/2015   . lisinopril-hydrochlorothiazide (PRINZIDE,ZESTORETIC) 20-12.5 MG tablet Take 2 tablets by mouth daily.   . nebivolol (BYSTOLIC) 5 MG tablet Take 1 tablet (5 mg total) by mouth daily.   Marland Kitchen SYNTHROID 137 MCG tablet TAKE 1 TABLET BY MOUTH ONCE DAILY BEFORE BREAKFAST   . [DISCONTINUED] nebivolol (BYSTOLIC) 5 MG tablet Take 1 tablet (5 mg total) by mouth daily.   Marland Kitchen albuterol (PROVENTIL HFA;VENTOLIN HFA) 108 (90 Base)  MCG/ACT inhaler Inhale 2 puffs into the lungs every 6 (six) hours as needed for wheezing or shortness of breath. Reported on 12/19/2015 (Patient not taking: Reported on 06/29/2017)   . albuterol (PROVENTIL) (2.5 MG/3ML) 0.083% nebulizer solution Take 3 mLs (2.5 mg total) by nebulization every 6 (six) hours as needed for wheezing or shortness of breath. (Patient not taking: Reported on 06/29/2017)   . Multiple Vitamins-Minerals (MULTIVITAMIN WITH MINERALS) tablet Take 1 tablet by mouth daily.   . Omega-3 Fatty Acids (FISH OIL PO) Take 1 capsule by mouth daily.  05/07/2017: Takes 2 daily   No facility-administered encounter medications on file as of 08/17/2017.    No Known Allergies  ROS: no headaches, URI symptoms, fever, chills, chest pain, shortness of breath, edema ,bleeding, bruising, rash. Denies anxiety, depression. Denies other concerns.    PHYSICAL EXAM: BP 120/86   Pulse 72   Ht 5\' 4"  (1.626 m)   Wt 213 lb (96.6 kg)   BMI 36.56 kg/m   130/88 on repeat by MD  Wt Readings from Last 3 Encounters:  08/17/17 213 lb (96.6 kg)  06/29/17 219 lb 3.2 oz (99.4 kg)  05/07/17 213 lb 9.6 oz (96.9 kg)   Well appearing, pleasant, obese female, in good spirits, in no distress HEENT: conjunctiva and sclera are clear, EOMI Neck: no lymphadenopathy, thyromegaly or carotid bruit Heart: regular rate and rhythm Lungs: clear bilaterally Extremities: no edema Psych: normal mood, affect, hygiene  and grooming. Slight anxious/worried--about leaving daughter to go away next week. Neuro: alert and oriented, cranial nerves intact, normal gait   ASSESSMENT/PLAN:  Essential hypertension, benign - improved control since adding bystolic 5mg , cont (given 2wks samples due to trip).  Continue lisinopril HCT. Low Na diet, exercise, wt loss rec - Plan: nebivolol (BYSTOLIC) 5 MG tablet   Your blood pressure sounds as though it is well controlled at home, and was okay here. Continue your current regimen of both  medications.  Remember to always make time for yourself. Find breaks for you to go for a walk, even if you are still helping others.

## 2017-08-17 ENCOUNTER — Ambulatory Visit: Payer: BC Managed Care – PPO | Admitting: Family Medicine

## 2017-08-17 ENCOUNTER — Encounter: Payer: Self-pay | Admitting: Family Medicine

## 2017-08-17 VITALS — BP 120/86 | HR 72 | Ht 64.0 in | Wt 213.0 lb

## 2017-08-17 DIAGNOSIS — I10 Essential (primary) hypertension: Secondary | ICD-10-CM

## 2017-08-17 MED ORDER — NEBIVOLOL HCL 5 MG PO TABS: 5.0000 mg | ORAL_TABLET | Freq: Every day | ORAL | 1 refills | Status: DC

## 2017-08-17 NOTE — Patient Instructions (Signed)
  Your blood pressure sounds as though it is well controlled at home, and was okay here. Continue your current regimen of both medications.  Remember to always make time for yourself. Find breaks for you to go for a walk, even if you are still helping others.

## 2017-09-22 ENCOUNTER — Other Ambulatory Visit: Payer: Self-pay | Admitting: Family Medicine

## 2017-09-22 DIAGNOSIS — E039 Hypothyroidism, unspecified: Secondary | ICD-10-CM

## 2017-09-30 ENCOUNTER — Other Ambulatory Visit: Payer: Self-pay | Admitting: Family Medicine

## 2017-09-30 DIAGNOSIS — I10 Essential (primary) hypertension: Secondary | ICD-10-CM

## 2017-12-21 ENCOUNTER — Other Ambulatory Visit: Payer: Self-pay | Admitting: Family Medicine

## 2017-12-21 DIAGNOSIS — E039 Hypothyroidism, unspecified: Secondary | ICD-10-CM

## 2018-03-02 ENCOUNTER — Other Ambulatory Visit: Payer: Self-pay | Admitting: Family Medicine

## 2018-03-02 DIAGNOSIS — I10 Essential (primary) hypertension: Secondary | ICD-10-CM

## 2018-03-18 ENCOUNTER — Other Ambulatory Visit: Payer: Self-pay | Admitting: Family Medicine

## 2018-03-18 DIAGNOSIS — E039 Hypothyroidism, unspecified: Secondary | ICD-10-CM

## 2018-03-31 ENCOUNTER — Other Ambulatory Visit: Payer: Self-pay | Admitting: Family Medicine

## 2018-03-31 DIAGNOSIS — I10 Essential (primary) hypertension: Secondary | ICD-10-CM

## 2018-05-17 ENCOUNTER — Encounter: Payer: BC Managed Care – PPO | Admitting: Family Medicine

## 2018-06-14 ENCOUNTER — Other Ambulatory Visit: Payer: Self-pay | Admitting: Family Medicine

## 2018-06-14 DIAGNOSIS — E039 Hypothyroidism, unspecified: Secondary | ICD-10-CM

## 2018-06-14 DIAGNOSIS — I10 Essential (primary) hypertension: Secondary | ICD-10-CM

## 2018-06-15 NOTE — Telephone Encounter (Signed)
Looks like she cancelled physical, rescheduled for May.  Her last TSH was 05/2017.  She needs to schedule a med check now, not waiting for May to do her labs.  She can have 30d refill of both meds (not 90), and needs med check within those 30d.  Thanks

## 2018-06-15 NOTE — Telephone Encounter (Signed)
Is this ok to refill?  Patient had an appointment December and cancelled it.

## 2018-06-22 NOTE — Progress Notes (Signed)
Chief Complaint  Patient presents with  . Hypertension    nonfasting med check. Has had increased stress/anxiety in the last few years would like to discuss.    November--husband had open heart surgery (mitral valve repair)--very stressful. He is doing great now.  She has a cold, has been using her nebulizer once daily while sick, mostly preventatively.  Hasn't needed to use albuterol inhaler. No fever, chills.  Has a cough. Sick contacts--grandchildren have had colds. Using Coricidin, and symptoms are pretty well controlled.  Hypertension.  Bystolic 5mg  was added to her lisinopril HCT 06/2017 due to blood pressures not being at goal. She tries to limit the salt in her diet. BP's haven't been checked since October/November.  122/72 on 11/3, which was last check.  Denies headaches, dizziness, syncope. Denies chest pain, palpitations.  Exercise: 2 weeks ago started playing pickleball, 3x/week.  She has had some mild elevations in TG in the past. HDL has been low, with elevated TC/HDL ratio. She tries to eat healthy, no fried foods (rare). No longer taking fish oil. She is not fasting today. Lab Results  Component Value Date   CHOL 181 05/07/2017   HDL 40 (L) 05/07/2017   LDLCALC 111 (H) 05/07/2017   TRIG 189 (H) 05/07/2017   CHOLHDL 4.5 05/07/2017   Hypothyroidism:  Denies changes in energy, hair, skin, bowels, moods.   +weight gain--she relates to stress, stress-eating, not having time to exercise (until recently). She reports compliance with her medications. She changed to taking her medication at 11pm (having had nothing to eat after dinner). Takes other medications in the morning. Skin is dry, but often happens in the winter. She is due for recheck of TSH. Lab Results  Component Value Date   TSH 3.71 05/07/2017   PMH, PSH, SH reviewed  Outpatient Encounter Medications as of 06/24/2018  Medication Sig Note  . albuterol (PROVENTIL) (2.5 MG/3ML) 0.083% nebulizer solution Take 3 mLs  (2.5 mg total) by nebulization every 6 (six) hours as needed for wheezing or shortness of breath.   Marland Kitchen aspirin EC 81 MG tablet Take 81 mg by mouth daily.   . Calcium Carbonate-Vitamin D (CALCIUM-VITAMIN D) 500-200 MG-UNIT per tablet Take 1 tablet by mouth daily.    . fexofenadine (ALLEGRA) 180 MG tablet Take 180 mg by mouth daily. Reported on 12/19/2015   . lisinopril-hydrochlorothiazide (PRINZIDE,ZESTORETIC) 20-12.5 MG tablet Take 2 tablets by mouth daily.   . nebivolol (BYSTOLIC) 5 MG tablet Take 1 tablet (5 mg total) by mouth daily.   Marland Kitchen SYNTHROID 137 MCG tablet TAKE 1 TABLET BY MOUTH ONCE DAILY BEFORE BREAKFAST   . [DISCONTINUED] BYSTOLIC 5 MG tablet TAKE 1 TABLET BY MOUTH ONCE DAILY   . [DISCONTINUED] lisinopril-hydrochlorothiazide (PRINZIDE,ZESTORETIC) 20-12.5 MG tablet TAKE 2 TABLETS BY MOUTH ONCE DAILY   . albuterol (PROVENTIL HFA;VENTOLIN HFA) 108 (90 Base) MCG/ACT inhaler Inhale 2 puffs into the lungs every 6 (six) hours as needed for wheezing or shortness of breath. Reported on 12/19/2015 (Patient not taking: Reported on 06/29/2017)   . Multiple Vitamins-Minerals (MULTIVITAMIN WITH MINERALS) tablet Take 1 tablet by mouth daily.   . Omega-3 Fatty Acids (FISH OIL PO) Take 1 capsule by mouth daily.  05/07/2017: Takes 2 daily   No facility-administered encounter medications on file as of 06/24/2018.    No Known Allergies  ROS: no fever, chills.  +URI/cough per HPI.  No shortness of breath, nausea, vomiting, diarrhea, bowel changes, skin/hair changes.  Denies depression, +recent stress.  Denies chest pain, palpitations.  No bleeding, bruising, rash. +weight gain. Moods are good now, recent stress. See HPI   PHYSICAL EXAM:  BP 134/88   Pulse 80   Ht 5\' 4"  (1.626 m)   Wt 232 lb 9.6 oz (105.5 kg)   BMI 39.93 kg/m   124/84 on repeat by MD Wt Readings from Last 3 Encounters:  06/24/18 232 lb 9.6 oz (105.5 kg)  08/17/17 213 lb (96.6 kg)  06/29/17 219 lb 3.2 oz (99.4 kg)    Well  appearing, obese, pleasant female in no distress HEENT: PERRL,EOMI, conjunctiva and sclera are clear TM's and EAc's normal.  Nasal mucosa with moderate edema, clear mucus. Sinuses nontender. OP is clear.  Neck: no lymphadenopathy, thyromegaly or bruit Heart: regular rate and rhythm Lungs: clear bilaterally Back: no CVA or spinal tenderness Abdomen: soft, nontender, no mas Extremities: no edema, normal pulses Psych: normal mood, affect, hygiene and grooming Neuro: alert and oriented, cranial nerves intact, normal gait.   ASSESSMENT/PLAN:  Essential hypertension, benign - controlled on current regimen. Regular exercise and weight loss encouraged - Plan: Comprehensive metabolic panel, nebivolol (BYSTOLIC) 5 MG tablet, lisinopril-hydrochlorothiazide (PRINZIDE,ZESTORETIC) 20-12.5 MG tablet  Hypothyroidism, unspecified type - other than weight gain, euthryoid. check TSH to ensure no dose adjustment needed - Plan: TSH  Dyslipidemia - reviewed lowfat, low cholesterol diet. will return when fasting to recheck lipids (if euthyroid) - Plan: Lipid panel  Obesity (BMI 30-39.9) - encouraged regular exercise, healthy diet, portion control and weight loss - Plan: Glucose, random  Essential hypertension, benign - Plan: Comprehensive metabolic panel, nebivolol (BYSTOLIC) 5 MG tablet, lisinopril-hydrochlorothiazide (PRINZIDE,ZESTORETIC) 20-12.5 MG tablet  Viral upper respiratory illness - no evidence of bacterial infection. Continue supportive measures  Chart reviewed for HM items-- UTD on mammo (due again in March), colonoscopy 10/2016. Doesn't need paps, s/p hysterectomy Imms UTD, had flu shot. Hasn't yet had Shingrix (not addressed today).

## 2018-06-24 ENCOUNTER — Encounter: Payer: Self-pay | Admitting: Family Medicine

## 2018-06-24 ENCOUNTER — Ambulatory Visit: Payer: BC Managed Care – PPO | Admitting: Family Medicine

## 2018-06-24 VITALS — BP 124/84 | HR 80 | Ht 64.0 in | Wt 232.6 lb

## 2018-06-24 DIAGNOSIS — E785 Hyperlipidemia, unspecified: Secondary | ICD-10-CM | POA: Diagnosis not present

## 2018-06-24 DIAGNOSIS — J069 Acute upper respiratory infection, unspecified: Secondary | ICD-10-CM

## 2018-06-24 DIAGNOSIS — E039 Hypothyroidism, unspecified: Secondary | ICD-10-CM

## 2018-06-24 DIAGNOSIS — I10 Essential (primary) hypertension: Secondary | ICD-10-CM

## 2018-06-24 DIAGNOSIS — E669 Obesity, unspecified: Secondary | ICD-10-CM | POA: Diagnosis not present

## 2018-06-24 MED ORDER — NEBIVOLOL HCL 5 MG PO TABS: 5.0000 mg | ORAL_TABLET | Freq: Every day | ORAL | 5 refills | Status: DC

## 2018-06-24 MED ORDER — LISINOPRIL-HYDROCHLOROTHIAZIDE 20-12.5 MG PO TABS: 2.0000 | ORAL_TABLET | Freq: Every day | ORAL | 1 refills | Status: DC

## 2018-06-24 NOTE — Patient Instructions (Signed)
Continue healthy diet, try and get regular exercise (at least 150 mins/week) and work on losing the weight you gained. We will contact you with your lab results and let you know if the thyroid needs to be adjusted. Your blood pressure was good.

## 2018-06-25 LAB — COMPREHENSIVE METABOLIC PANEL
ALT: 20 IU/L (ref 0–32)
AST: 17 IU/L (ref 0–40)
Albumin/Globulin Ratio: 1.5 (ref 1.2–2.2)
Albumin: 4.1 g/dL (ref 3.8–4.8)
Alkaline Phosphatase: 49 IU/L (ref 39–117)
BUN/Creatinine Ratio: 22 (ref 12–28)
BUN: 15 mg/dL (ref 8–27)
Bilirubin Total: 0.3 mg/dL (ref 0.0–1.2)
CO2: 29 mmol/L (ref 20–29)
Calcium: 9.6 mg/dL (ref 8.7–10.3)
Chloride: 98 mmol/L (ref 96–106)
Creatinine, Ser: 0.67 mg/dL (ref 0.57–1.00)
GFR calc Af Amer: 109 mL/min/{1.73_m2} (ref >59)
GFR calc non Af Amer: 95 mL/min/{1.73_m2} (ref >59)
Globulin, Total: 2.7 g/dL (ref 1.5–4.5)
Glucose: 77 mg/dL (ref 65–99)
Potassium: 4 mmol/L (ref 3.5–5.2)
Sodium: 140 mmol/L (ref 134–144)
Total Protein: 6.8 g/dL (ref 6.0–8.5)

## 2018-06-25 LAB — TSH: TSH: 3.4 u[IU]/mL (ref 0.450–4.500)

## 2018-06-25 MED ORDER — SYNTHROID 137 MCG PO TABS: ORAL_TABLET | ORAL | 11 refills | Status: DC

## 2018-06-25 NOTE — Addendum Note (Signed)
Addended by: Rita Ohara on: 06/25/2018 07:33 AM   Modules accepted: Orders

## 2018-07-06 ENCOUNTER — Other Ambulatory Visit: Payer: Self-pay | Admitting: Family Medicine

## 2018-07-06 DIAGNOSIS — J45998 Other asthma: Secondary | ICD-10-CM

## 2018-07-06 NOTE — Telephone Encounter (Signed)
Is this ok to refill?  

## 2018-07-27 ENCOUNTER — Other Ambulatory Visit: Payer: Self-pay | Admitting: Family Medicine

## 2018-07-27 DIAGNOSIS — Z1231 Encounter for screening mammogram for malignant neoplasm of breast: Secondary | ICD-10-CM

## 2018-08-26 ENCOUNTER — Ambulatory Visit: Payer: PRIVATE HEALTH INSURANCE

## 2018-09-28 ENCOUNTER — Ambulatory Visit: Payer: PRIVATE HEALTH INSURANCE

## 2018-10-04 ENCOUNTER — Telehealth: Payer: Self-pay | Admitting: *Deleted

## 2018-10-04 DIAGNOSIS — Z0000 Encounter for general adult medical examination without abnormal findings: Secondary | ICD-10-CM

## 2018-10-04 DIAGNOSIS — E039 Hypothyroidism, unspecified: Secondary | ICD-10-CM

## 2018-10-04 DIAGNOSIS — Z5181 Encounter for therapeutic drug level monitoring: Secondary | ICD-10-CM

## 2018-10-04 NOTE — Telephone Encounter (Signed)
R/s'd patient for CPE for 12/15/2018, she is coming in for labs 12/13/2018-can you please order labs or let me know what she needs please, thanks.

## 2018-10-07 ENCOUNTER — Encounter: Payer: BC Managed Care – PPO | Admitting: Family Medicine

## 2018-10-10 NOTE — Addendum Note (Signed)
Addended byRita Ohara on: 10/10/2018 02:54 PM   Modules accepted: Orders

## 2018-10-10 NOTE — Telephone Encounter (Signed)
I entered for TSH and CBC.  She already has future orders (dated for 07/2018) for glucose and lipids, which I also still want her to have (not sure if those will be missed or still seen? Do we need to change the expected date?)

## 2018-10-14 NOTE — Telephone Encounter (Signed)
I extended just in case.

## 2018-11-05 ENCOUNTER — Ambulatory Visit
Admission: RE | Admit: 2018-11-05 | Discharge: 2018-11-05 | Disposition: A | Discharge status: Home / Self Care | Payer: BC Managed Care – PPO | Source: Ambulatory Visit | Attending: Family Medicine | Admitting: Family Medicine

## 2018-11-05 ENCOUNTER — Other Ambulatory Visit: Payer: Self-pay

## 2018-11-05 DIAGNOSIS — Z1231 Encounter for screening mammogram for malignant neoplasm of breast: Secondary | ICD-10-CM

## 2018-12-13 ENCOUNTER — Other Ambulatory Visit: Payer: Self-pay

## 2018-12-15 ENCOUNTER — Encounter: Payer: Self-pay | Admitting: Family Medicine

## 2018-12-18 ENCOUNTER — Other Ambulatory Visit: Payer: Self-pay | Admitting: Family Medicine

## 2018-12-18 DIAGNOSIS — I10 Essential (primary) hypertension: Secondary | ICD-10-CM

## 2018-12-20 ENCOUNTER — Telehealth: Payer: Self-pay | Admitting: *Deleted

## 2018-12-20 NOTE — Telephone Encounter (Signed)
Patient canceled her 7/13 lab and 7/15 CPE and rescheduled for 10/2 and 10/5-her daughter had her R foot amputated-she has been helping her. I did refill her bp med x 90 days last week. Couldn't remember if you said you wanted to do a virtual prior to the CPE.

## 2018-12-20 NOTE — Telephone Encounter (Signed)
Patient advised.

## 2018-12-20 NOTE — Telephone Encounter (Signed)
Last med check was 06/2018.  Okay to wait until October (but if she cancels then, will need a med check). Last CPE was 05/2017

## 2019-01-05 ENCOUNTER — Other Ambulatory Visit: Payer: Self-pay | Admitting: Family Medicine

## 2019-01-05 DIAGNOSIS — I10 Essential (primary) hypertension: Secondary | ICD-10-CM

## 2019-03-04 ENCOUNTER — Other Ambulatory Visit: Payer: BC Managed Care – PPO

## 2019-03-04 ENCOUNTER — Other Ambulatory Visit: Payer: Self-pay

## 2019-03-04 DIAGNOSIS — E039 Hypothyroidism, unspecified: Secondary | ICD-10-CM

## 2019-03-04 DIAGNOSIS — Z5181 Encounter for therapeutic drug level monitoring: Secondary | ICD-10-CM

## 2019-03-04 DIAGNOSIS — Z0000 Encounter for general adult medical examination without abnormal findings: Secondary | ICD-10-CM

## 2019-03-04 DIAGNOSIS — E669 Obesity, unspecified: Secondary | ICD-10-CM

## 2019-03-04 DIAGNOSIS — E785 Hyperlipidemia, unspecified: Secondary | ICD-10-CM

## 2019-03-05 LAB — CBC WITH DIFFERENTIAL/PLATELET
Basophils Absolute: 0.1 10*3/uL (ref 0.0–0.2)
Basos: 1 %
EOS (ABSOLUTE): 0.3 10*3/uL (ref 0.0–0.4)
Eos: 4 %
Hematocrit: 42.2 % (ref 34.0–46.6)
Hemoglobin: 14.2 g/dL (ref 11.1–15.9)
Immature Grans (Abs): 0 10*3/uL (ref 0.0–0.1)
Immature Granulocytes: 0 %
Lymphocytes Absolute: 3 10*3/uL (ref 0.7–3.1)
Lymphs: 51 %
MCH: 33.2 pg — ABNORMAL HIGH (ref 26.6–33.0)
MCHC: 33.6 g/dL (ref 31.5–35.7)
MCV: 99 fL — ABNORMAL HIGH (ref 79–97)
Monocytes Absolute: 0.5 10*3/uL (ref 0.1–0.9)
Monocytes: 9 %
Neutrophils Absolute: 2 10*3/uL (ref 1.4–7.0)
Neutrophils: 35 %
Platelets: 214 10*3/uL (ref 150–450)
RBC: 4.28 x10E6/uL (ref 3.77–5.28)
RDW: 12.8 % (ref 11.7–15.4)
WBC: 5.8 10*3/uL (ref 3.4–10.8)

## 2019-03-05 LAB — GLUCOSE, RANDOM: Glucose: 98 mg/dL (ref 65–99)

## 2019-03-05 LAB — LIPID PANEL
Chol/HDL Ratio: 4.2 ratio (ref 0.0–4.4)
Cholesterol, Total: 147 mg/dL (ref 100–199)
HDL: 35 mg/dL — ABNORMAL LOW (ref >39)
LDL Chol Calc (NIH): 71 mg/dL (ref 0–99)
Triglycerides: 249 mg/dL — ABNORMAL HIGH (ref 0–149)
VLDL Cholesterol Cal: 41 mg/dL — ABNORMAL HIGH (ref 5–40)

## 2019-03-05 LAB — TSH: TSH: 4.05 u[IU]/mL (ref 0.450–4.500)
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
## 2019-03-06 NOTE — Patient Instructions (Addendum)
HEALTH MAINTENANCE RECOMMENDATIONS:  It is recommended that you get at least 30 minutes of aerobic exercise at least 5 days/week (for weight loss, you may need as much as 60-90 minutes). This can be any activity that gets your heart rate up. This can be divided in 10-15 minute intervals if needed, but try and build up your endurance at least once a week.  Weight bearing exercise is also recommended twice weekly.  Eat a healthy diet with lots of vegetables, fruits and fiber.  "Colorful" foods have a lot of vitamins (ie green vegetables, tomatoes, red peppers, etc).  Limit sweet tea, regular sodas and alcoholic beverages, all of which has a lot of calories and sugar.  Up to 1 alcoholic drink daily may be beneficial for women (unless trying to lose weight, watch sugars).  Drink a lot of water.  Calcium recommendations are 1200-1500 mg daily (1500 mg for postmenopausal women or women without ovaries), and vitamin D 1000 IU daily.  This should be obtained from diet and/or supplements (vitamins), and calcium should not be taken all at once, but in divided doses.  Monthly self breast exams and yearly mammograms for women over the age of 20 is recommended.  Sunscreen of at least SPF 30 should be used on all sun-exposed parts of the skin when outside between the hours of 10 am and 4 pm (not just when at beach or pool, but even with exercise, golf, tennis, and yard work!)  Use a sunscreen that says "broad spectrum" so it covers both UVA and UVB rays, and make sure to reapply every 1-2 hours.  Remember to change the batteries in your smoke detectors when changing your clock times in the spring and fall.  Carbon monoxide detectors are recommended for your home.  Use your seat belt every time you are in a car, and please drive safely and not be distracted with cell phones and texting while driving.   Monitor your blood pressure at home, and record on the sheet provided.   Follow low sodium diet (see below),  get regular exercise, to help keep the blood pressure down. Goal blood pressure is under 130/80.  If it remains >150/90 in the next week or two, please contact us as we will adjust your medication sooner rather than later.  If it is fluctuating, sometimes okay, sometimes high, we can give you 4-6 weeks to work on diet, exercise and weight loss to see if they come down. Expect to send [place] your list in 4-6 weeks (but contact us sooner if they are staying high).   MyPlate from [employer] is an outline of a general healthy diet based on the 2010 Dietary Guidelines for Americans, from the U.S. Department of Agriculture Scientist, research (physical sciences)). It sets guidelines for how much food you should eat from each food group based on your age, sex, and level of physical activity. What are tips for following MyPlate? To follow MyPlate recommendations:  Eat a wide variety of fruits and vegetables, grains, and protein foods.  Serve smaller portions and eat less food throughout the day.  Limit portion sizes to avoid overeating.  Enjoy your food.  Get at least 150 minutes of exercise every week. This is about 30 minutes each day, 5 or more days per week. It can be difficult to have every meal look like MyPlate. Think about MyPlate as eating guidelines for an entire day, rather than each individual meal. Fruits and vegetables  Make half of your plate fruits and  vegetables.  Eat many different colors of fruits and vegetables each day.  For a 2,000 calorie daily food plan, eat: ? 2 cups of vegetables every day. ? 2 cups of fruit every day.  1 cup is equal to: ? 1 cup raw or cooked vegetables. ? 1 cup raw fruit. ? 1 medium-sized orange, apple, or banana. ? 1 cup 100% fruit or vegetable juice. ? 2 cups raw leafy greens, such as lettuce, spinach, or kale. ?  cup dried fruit. Grains  One fourth of your plate should be grains.  Make at least half of the grains you eat each day whole grains.  For a 2,000 calorie  daily food plan, eat 6 oz of grains every day.  1 oz is equal to: ? 1 slice bread. ? 1 cup cereal. ?  cup cooked rice, cereal, or pasta. Protein  One fourth of your plate should be protein.  Eat a wide variety of protein foods, including meat, poultry, fish, eggs, beans, nuts, and tofu.  For a 2,000 calorie daily food plan, eat 5 oz of protein every day.  1 oz is equal to: ? 1 oz meat, poultry, or fish. ?  cup cooked beans. ? 1 egg. ?  oz nuts or seeds. ? 1 Tbsp peanut butter. Dairy  Drink fat-free or low-fat (1%) milk.  Eat or drink dairy as a side to meals.  For a 2,000 calorie daily food plan, eat or drink 3 cups of dairy every day.  1 cup is equal to: ? 1 cup milk, yogurt, cottage cheese, or soy milk (soy beverage). ? 2 oz processed cheese. ? 1 oz natural cheese. Fats, oils, salt, and sugars  Only small amounts of oils are recommended.  Avoid foods that are high in calories and low in nutritional value (empty calories), like foods high in fat or added sugars.  Choose foods that are low in salt (sodium). Choose foods that have less than 140 milligrams (mg) of sodium per serving.  Drink water instead of sugary drinks. Drink enough water each day to keep your urine pale yellow. Where to find support  Work with your health care provider or a nutrition specialist (dietitian) to develop a customized eating plan that is right for you.  Download an app (mobile application) to help you track your daily food intake. Where to find more information  Go to [email or web address] for more information.  Learn more and log your daily food intake according to MyPlate using USDA's SuperTracker: www.supertracker.usda.gov Summary  MyPlate is a general guideline for healthy eating from the USDA. It is based on the 2010 Dietary Guidelines for Americans.  In general, fruits and vegetables should take up  of your plate, grains should take up  of your plate, and protein should  take up  of your plate. This information is not intended to replace advice given to you by your health care provider. Make sure you discuss any questions you have with your health care provider. Document Released: 06/08/2007 Document Revised: 06/20/2017 Document Reviewed: 08/18/2016 Elsevier Patient Education  [place].   Low-Sodium Eating Plan Sodium, which is an element that makes up salt, helps you maintain a healthy balance of fluids in your body. Too much sodium can increase your blood pressure and cause fluid and waste to be held in your body. Your health care provider or dietitian may recommend following this plan if you have high blood pressure (hypertension), kidney disease, liver disease, or heart failure. Eating less sodium  can help lower your blood pressure, reduce swelling, and protect your heart, liver, and kidneys. What are tips for following this plan? General guidelines  Most people on this plan should limit their sodium intake to 1,500-2,000 mg (milligrams) of sodium each day. Reading food labels   The Nutrition Facts label lists the amount of sodium in one serving of the food. If you eat more than one serving, you must multiply the listed amount of sodium by the number of servings.  Choose foods with less than 140 mg of sodium per serving.  Avoid foods with 300 mg of sodium or more per serving. Shopping  Look for lower-sodium products, often labeled as "low-sodium" or "no salt added."  Always check the sodium content even if foods are labeled as "unsalted" or "no salt added".  Buy fresh foods. ? Avoid canned foods and premade or frozen meals. ? Avoid canned, cured, or processed meats  Buy breads that have less than 80 mg of sodium per slice. Cooking  Eat more home-cooked food and less restaurant, buffet, and fast food.  Avoid adding salt when cooking. Use salt-free seasonings or herbs instead of table salt or sea salt. Check with your health care  provider or pharmacist before using salt substitutes.  Cook with plant-based oils, such as canola, sunflower, or olive oil. Meal planning  When eating at a restaurant, ask that your food be prepared with less salt or no salt, if possible.  Avoid foods that contain MSG (monosodium glutamate). MSG is sometimes added to [place] food, bouillon, and some canned foods. What foods are recommended? The items listed may not be a complete list. Talk with your dietitian about what dietary choices are best for you. Grains Low-sodium cereals, including oats, puffed wheat and rice, and shredded wheat. Low-sodium crackers. Unsalted rice. Unsalted pasta. Low-sodium bread. Whole-grain breads and whole-grain pasta. Vegetables Fresh or frozen vegetables. "No salt added" canned vegetables. "No salt added" tomato sauce and paste. Low-sodium or reduced-sodium tomato and vegetable juice. Fruits Fresh, frozen, or canned fruit. Fruit juice. Meats and other protein foods Fresh or frozen (no salt added) meat, poultry, seafood, and fish. Low-sodium canned tuna and salmon. Unsalted nuts. Dried peas, beans, and lentils without added salt. Unsalted canned beans. Eggs. Unsalted nut butters. Dairy Milk. Soy milk. Cheese that is naturally low in sodium, such as ricotta cheese, fresh mozzarella, or Swiss cheese Low-sodium or reduced-sodium cheese. Cream cheese. Yogurt. Fats and oils Unsalted butter. Unsalted margarine with no trans fat. Vegetable oils such as canola or olive oils. Seasonings and other foods Fresh and dried herbs and spices. Salt-free seasonings. Low-sodium mustard and ketchup. Sodium-free salad dressing. Sodium-free light mayonnaise. Fresh or refrigerated horseradish. Lemon juice. Vinegar. Homemade, reduced-sodium, or low-sodium soups. Unsalted popcorn and pretzels. Low-salt or salt-free chips. What foods are not recommended? The items listed may not be a complete list. Talk with your dietitian about what  dietary choices are best for you. Grains Instant hot cereals. Bread stuffing, pancake, and biscuit mixes. Croutons. Seasoned rice or pasta mixes. Noodle soup cups. Boxed or frozen macaroni and cheese. Regular salted crackers. Self-rising flour. Vegetables Sauerkraut, pickled vegetables, and relishes. Olives. [place] fries. Onion rings. Regular canned vegetables (not low-sodium or reduced-sodium). Regular canned tomato sauce and paste (not low-sodium or reduced-sodium). Regular tomato and vegetable juice (not low-sodium or reduced-sodium). Frozen vegetables in sauces. Meats and other protein foods Meat or fish that is salted, canned, smoked, spiced, or pickled. Bacon, ham, sausage, hotdogs, corned beef, chipped beef, packaged  lunch meats, salt pork, jerky, pickled herring, anchovies, regular canned tuna, sardines, salted nuts. Dairy Processed cheese and cheese spreads. Cheese curds. Blue cheese. Feta cheese. String cheese. Regular cottage cheese. Buttermilk. Canned milk. Fats and oils Salted butter. Regular margarine. Ghee. Bacon fat. Seasonings and other foods Onion salt, garlic salt, seasoned salt, table salt, and sea salt. Canned and packaged gravies. Worcestershire sauce. Tartar sauce. Barbecue sauce. Teriyaki sauce. Soy sauce, including reduced-sodium. Steak sauce. Fish sauce. Oyster sauce. Cocktail sauce. Horseradish that you find on the shelf. Regular ketchup and mustard. Meat flavorings and tenderizers. Bouillon cubes. Hot sauce and Tabasco sauce. Premade or packaged marinades. Premade or packaged taco seasonings. Relishes. Regular salad dressings. Salsa. Potato and tortilla chips. Corn chips and puffs. Salted popcorn and pretzels. Canned or dried soups. Pizza. Frozen entrees and pot pies. Summary  Eating less sodium can help lower your blood pressure, reduce swelling, and protect your heart, liver, and kidneys.  Most people on this plan should limit their sodium intake to 1,500-2,000 mg  (milligrams) of sodium each day.  Canned, boxed, and frozen foods are high in sodium. Restaurant foods, fast foods, and pizza are also very high in sodium. You also get sodium by adding salt to food.  Try to cook at home, eat more fresh fruits and vegetables, and eat less fast food, canned, processed, or prepared foods. This information is not intended to replace advice given to you by your health care provider. Make sure you discuss any questions you have with your health care provider. Document Released: 11/08/2001 Document Revised: 05/01/2017 Document Reviewed: 05/12/2016 Elsevier Patient Education  2020 [place] and Cholesterol Restricted Eating Plan Getting too much fat and cholesterol in your diet may cause health problems. Choosing the right foods helps keep your fat and cholesterol at normal levels. This can keep you from getting certain diseases. Meal planning  At meals, divide your plate into four equal parts: ? Fill one-half of your plate with vegetables and green salads. ? Fill one-fourth of your plate with whole grains. ? Fill one-fourth of your plate with low-fat (lean) protein foods.  Eat fish that is high in omega-3 fats at least two times a week. This includes mackerel, tuna, sardines, and salmon.  Eat foods that are high in fiber, such as whole grains, beans, apples, broccoli, carrots, peas, and barley. General tips   Work with your doctor to lose weight if you need to.  Avoid: ? Foods with added sugar. ? Fried foods. ? Foods with partially hydrogenated oils.  Limit alcohol intake to no more than 1 drink a day for nonpregnant women and 2 drinks a day for men. One drink equals 12 oz of beer, 5 oz of wine, or 1 oz of hard liquor. Reading food labels  Check food labels for: ? Trans fats. ? Partially hydrogenated oils. ? Saturated fat (g) in each serving. ? Cholesterol (mg) in each serving. ? Fiber (g) in each serving.  Choose foods with healthy  fats, such as: ? Monounsaturated fats. ? Polyunsaturated fats. ? Omega-3 fats.  Choose grain products that have whole grains. Look for the word "whole" as the first word in the ingredient list. Cooking  Cook foods using low-fat methods. These include baking, boiling, grilling, and broiling.  Eat more home-cooked foods. Eat at restaurants and buffets less often.  Avoid cooking using saturated fats, such as butter, cream, palm oil, palm kernel oil, and coconut oil.   Recommended foods  Fruits  All  fresh, canned (in natural juice), or frozen fruits. Vegetables  Fresh or frozen vegetables (raw, steamed, roasted, or grilled). Green salads. Grains  Whole grains, such as whole wheat or whole grain breads, crackers, cereals, and pasta. Unsweetened oatmeal, bulgur, barley, quinoa, or brown rice. Corn or whole wheat flour tortillas. Meats and other protein foods  Ground beef (85% or leaner), grass-fed beef, or beef trimmed of fat. Skinless chicken or [place]. Ground chicken or [place]. Pork trimmed of fat. All fish and seafood. Egg whites. Dried beans, peas, or lentils. Unsalted nuts or seeds. Unsalted canned beans. Nut butters without added sugar or oil. Dairy  Low-fat or nonfat dairy products, such as skim or 1% milk, 2% or reduced-fat cheeses, low-fat and fat-free ricotta or cottage cheese, or plain low-fat and nonfat yogurt. Fats and oils  Tub margarine without trans fats. Light or reduced-fat mayonnaise and salad dressings. Avocado. Olive, canola, sesame, or safflower oils. The items listed above may not be a complete list of foods and beverages you can eat. Contact a dietitian for more information.  Foods to avoid Fruits  Canned fruit in heavy syrup. Fruit in cream or butter sauce. Fried fruit. Vegetables  Vegetables cooked in cheese, cream, or butter sauce. Fried vegetables. Grains  White bread. White pasta. White rice. Cornbread. Bagels, pastries, and croissants. Crackers  and snack foods that contain trans fat and hydrogenated oils. Meats and other protein foods  Fatty cuts of meat. Ribs, chicken wings, bacon, sausage, bologna, salami, chitterlings, fatback, hot dogs, bratwurst, and packaged lunch meats. Liver and organ meats. Whole eggs and egg yolks. Chicken and [place] with skin. Fried meat. Dairy  Whole or 2% milk, cream, half-and-half, and cream cheese. Whole milk cheeses. Whole-fat or sweetened yogurt. Full-fat cheeses. Nondairy creamers and whipped toppings. Processed cheese, cheese spreads, and cheese curds. Beverages  Alcohol. Sugar-sweetened drinks such as sodas, lemonade, and fruit drinks. Fats and oils  Butter, stick margarine, lard, shortening, ghee, or bacon fat. Coconut, palm kernel, and palm oils. Sweets and desserts  Corn syrup, sugars, honey, and molasses. Candy. Jam and jelly. Syrup. Sweetened cereals. Cookies, pies, cakes, donuts, muffins, and ice cream. The items listed above may not be a complete list of foods and beverages you should avoid. Contact a dietitian for more information.  Summary  Choosing the right foods helps keep your fat and cholesterol at normal levels. This can keep you from getting certain diseases.  At meals, fill one-half of your plate with vegetables and green salads.  Eat high-fiber foods, like whole grains, beans, apples, carrots, peas, and barley.  Limit added sugar, saturated fats, alcohol, and fried foods. This information is not intended to replace advice given to you by your health care provider. Make sure you discuss any questions you have with your health care provider. Document Released: 11/18/2011 Document Revised: 01/20/2018 Document Reviewed: 02/03/2017 Elsevier Patient Education  2020 [employer].

## 2019-03-07 ENCOUNTER — Encounter: Payer: Self-pay | Admitting: Family Medicine

## 2019-03-07 ENCOUNTER — Ambulatory Visit (INDEPENDENT_AMBULATORY_CARE_PROVIDER_SITE_OTHER): Payer: BC Managed Care – PPO | Admitting: Family Medicine

## 2019-03-07 ENCOUNTER — Other Ambulatory Visit: Payer: Self-pay

## 2019-03-07 VITALS — BP 160/100 | HR 80 | Temp 98.4°F | Ht 64.0 in | Wt 242.4 lb

## 2019-03-07 DIAGNOSIS — J302 Other seasonal allergic rhinitis: Secondary | ICD-10-CM

## 2019-03-07 DIAGNOSIS — I10 Essential (primary) hypertension: Secondary | ICD-10-CM | POA: Diagnosis not present

## 2019-03-07 DIAGNOSIS — E559 Vitamin D deficiency, unspecified: Secondary | ICD-10-CM | POA: Diagnosis not present

## 2019-03-07 DIAGNOSIS — E039 Hypothyroidism, unspecified: Secondary | ICD-10-CM | POA: Diagnosis not present

## 2019-03-07 DIAGNOSIS — Z23 Encounter for immunization: Secondary | ICD-10-CM | POA: Diagnosis not present

## 2019-03-07 DIAGNOSIS — E785 Hyperlipidemia, unspecified: Secondary | ICD-10-CM

## 2019-03-07 DIAGNOSIS — Z0000 Encounter for general adult medical examination without abnormal findings: Secondary | ICD-10-CM | POA: Diagnosis not present

## 2019-03-07 DIAGNOSIS — J45998 Other asthma: Secondary | ICD-10-CM

## 2019-03-07 MED ORDER — NEBIVOLOL HCL 5 MG PO TABS: 5.0000 mg | ORAL_TABLET | Freq: Every day | ORAL | 0 refills | Status: DC

## 2019-03-07 MED ORDER — LISINOPRIL-HYDROCHLOROTHIAZIDE 20-12.5 MG PO TABS: 2.0000 | ORAL_TABLET | Freq: Every day | ORAL | 1 refills | Status: DC

## 2019-03-16 ENCOUNTER — Encounter: Payer: Self-pay | Admitting: Family Medicine

## 2019-05-23 ENCOUNTER — Other Ambulatory Visit: Payer: Self-pay | Admitting: Family Medicine

## 2019-05-23 DIAGNOSIS — I10 Essential (primary) hypertension: Secondary | ICD-10-CM

## 2019-06-10 ENCOUNTER — Other Ambulatory Visit: Payer: Self-pay

## 2019-06-10 ENCOUNTER — Other Ambulatory Visit: Payer: BC Managed Care – PPO

## 2019-06-10 DIAGNOSIS — E039 Hypothyroidism, unspecified: Secondary | ICD-10-CM

## 2019-06-10 DIAGNOSIS — E785 Hyperlipidemia, unspecified: Secondary | ICD-10-CM

## 2019-06-11 LAB — LIPID PANEL
Chol/HDL Ratio: 4.4 ratio (ref 0.0–4.4)
Cholesterol, Total: 163 mg/dL (ref 100–199)
HDL: 37 mg/dL — ABNORMAL LOW (ref >39)
LDL Chol Calc (NIH): 86 mg/dL (ref 0–99)
Triglycerides: 240 mg/dL — ABNORMAL HIGH (ref 0–149)
VLDL Cholesterol Cal: 40 mg/dL (ref 5–40)

## 2019-06-11 LAB — TSH: TSH: 8 u[IU]/mL — ABNORMAL HIGH (ref 0.450–4.500)

## 2019-06-12 NOTE — Progress Notes (Signed)
Start time: 11:40 End time: 12:12  Virtual Visit via Video Note  I connected with Tamara Rosales on 06/13/19 by a video enabled telemedicine application and verified that I am speaking with the correct person using two identifiers.  Location: Patient: car (pulled over, not driving) Provider: office   I discussed the limitations of evaluation and management by telemedicine and the availability of in person appointments. The patient expressed understanding and agreed to proceed.  History of Present Illness:  Chief Complaint  Patient presents with  . Hypertension    VIRTUAL med check. If she is to remain on the 7.5mg  of Bystolic and you please call in rx for 7.5mg . (5mg  and 2.5mg )    At her last visit in October, she was very overwhelmed.  Her oldest daughter Anderson Malta had her leg amputated in July (from diabetes), and she and her daughter were living with her. She felt like she lost control of everything since early August. She got her prosthesis, which "is wonderful", got a Dexcom, which has also been very helpful. So things are a little better than last time, but still is busy, doesn't feel like she has full control of her life. Daughter is separated from her husband (drug addict), and plan is to move out when she can afford to.   She had gained a lot of weight. She doesn't feel like her diet has changed much, but blames her weight gain on lack of activity.  She started writing down her food intake the beginning of August, weighing herself weekly, and reports that she has lost 9# on her home since then. She continues to try and watch her diet.  She ha been walking 3x/week with her husband, and 30 minutes almost every night in place while watching TV.  Hypertension. She continues on Bystolic 7.5mg  and lisinopril HCT. She tries to limit the salt in her diet.  Bystolic dose was increased to 7.5mg  in October. She has trouble cutting the triangular shaped pills; even with pill cutter, it  isn't cutting evenly. Asking to change dose to avoid having to cut the tablets. BP's are running 120's-140, mainly 130's/80's.  Denies headaches, dizziness, syncope. Denies chest pain, palpitations, edema or muscle cramps.  Dyslipidemia: She has had some mild elevations in TG in the past. HDL has been low, with elevated TC/HDL ratio.She tries to eat healthy, no fried foods (rare), not much sweets. She still is not taking any taking fish oil.  Hypothyroidism:Shereports compliance with her medications. She continues to take medication at 11pm (having had nothing to eat after dinner), and takes her other medications in the morning. She denies any missed pills. Denieschanges in energy, moods.Hair has been falling out some (in the shower sees it, doesn't notice any thinning). Constipation is new for her. Skin on her hands and legs are dry. She is tired, but thinks related to being busy/what's going on. This is about the same as at last visit, not worse Last TSH was 4.050 in 03/2019.  See below for labs done prior to visit.  She continuestoonly rarely havemigraines since she cut out the preservatives from her diet. Very rare, relieved by OTC medication.  Asthma and allergies: Allergies flare in Spring and Fall. Asthma flares only when allergies aren't well controlled. She takes Allegra daily, and this has helped a lot.  Only uses flonase prn, not recently (in October). She has albuterol nebulizer and inhaler.  She last needed to use albuterol nebulizer in October. Last spirometry was 03/2019, showing  mild airway obstruction.  Vitamin D deficiency: Last level was35 in 05/2017 when taking 1000 IU of Vitamin D3 daily. She continues to take 1000 IU daily.  PMH, PSH, SH reviewed  Outpatient Encounter Medications as of 06/13/2019  Medication Sig  . aspirin EC 81 MG tablet Take 81 mg by mouth daily.  Marland Kitchen BYSTOLIC 5 MG tablet Take 1 tablet by mouth once daily (Patient taking  differently: Take 1.5 mg by mouth daily. )  . Calcium Carbonate-Vitamin D (CALCIUM-VITAMIN D) 500-200 MG-UNIT per tablet Take 1 tablet by mouth daily.   . fexofenadine (ALLEGRA) 180 MG tablet Take 180 mg by mouth daily. Reported on 12/19/2015  . lisinopril-hydrochlorothiazide (ZESTORETIC) 20-12.5 MG tablet Take 2 tablets by mouth daily.  Marland Kitchen SYNTHROID 137 MCG tablet TAKE 1 TABLET BY MOUTH ONCE DAILY BEFORE BREAKFAST  . [DISCONTINUED] nebivolol (BYSTOLIC) 5 MG tablet   . albuterol (PROVENTIL HFA;VENTOLIN HFA) 108 (90 Base) MCG/ACT inhaler Inhale 2 puffs into the lungs every 6 (six) hours as needed for wheezing or shortness of breath. Reported on 12/19/2015 (Patient not taking: Reported on 06/29/2017)  . albuterol (PROVENTIL) (2.5 MG/3ML) 0.083% nebulizer solution USE 1 VIAL IN NEBULIZER EVERY 6 HOURS AS NEEDED FOR WHEEZING OR SHORTNESS OF BREATH (Patient not taking: Reported on 03/07/2019)   No facility-administered encounter medications on file as of 06/13/2019.   No Known Allergies  ROS:  No fever, chills, URI symptoms.  Allergies controlled, per HPI. Denies any wheezing, cough, shortness of breath. No headaches or dizziness. No chest pain, palpitations.  No GI or GU complaints.  No bleeding, bruising or rashes, just dry skin. Moods are good most of the time   Observations/Objective:  BP 132/84   Pulse 74   Ht 5\' 4"  (1.626 m)   Wt 235 lb (106.6 kg)   BMI 40.34 kg/m   Wt Readings from Last 3 Encounters:  06/13/19 235 lb (106.6 kg)  03/07/19 242 lb 6.4 oz (110 kg)  06/24/18 232 lb 9.6 oz (105.5 kg)   Last weight was on home scale, can't directly compare to October's weight.  Well-appearing, pleasant female, seen in her car. She is alert, oriented.  Cranial nerves are grossly intact. She has normal mood, affect, grooming, eye contact and speech. Exam is otherwise limited due to the virtual nature of the visit.  Lab Results  Component Value Date   TSH 8.000 (H) 06/10/2019   Lab  Results  Component Value Date   CHOL 163 06/10/2019   HDL 37 (L) 06/10/2019   LDLCALC 86 06/10/2019   TRIG 240 (H) 06/10/2019   CHOLHDL 4.4 06/10/2019     Assessment and Plan:  Hypothyroidism, unspecified type - suboptimally replaced, increase dose to 174mcg and recheck in 6-8 wks - Plan: SYNTHROID 150 MCG tablet  Essential hypertension, benign - controlled on current regimen--change to 2.5mg  tablets to take 3 tabs to avoid cutting bystolic.  - Plan: nebivolol (BYSTOLIC) 2.5 MG tablet  Dyslipidemia - elevated TG. Reviewed proper diet, and rec starting omega-3 fish oil up to 4000mg  daily  Vitamin D deficiency - continue daily supplements  Class 3 severe obesity due to excess calories with serious comorbidity and body mass index (BMI) of 40.0 to 44.9 in adult ([place]) - Counseled re: healthy diet, exercise, weight loss   Return for TSH in 6-8 weeks (nonfasting) Eventually will need lipids rechecked, why thyroid adequately replaced. Has CPE scheduled for October   Follow Up Instructions:    I discussed the assessment and treatment plan  with the patient. The patient was provided an opportunity to ask questions and all were answered. The patient agreed with the plan and demonstrated an understanding of the instructions.   The patient was advised to call back or seek an in-person evaluation if the symptoms worsen or if the condition fails to improve as anticipated.  I provided 32 minutes of video face-to-face time during this encounter. Additional 10-15 mins spent reviewing prior notes, labs, and documenting   Vikki Ports, MD

## 2019-06-13 ENCOUNTER — Ambulatory Visit (INDEPENDENT_AMBULATORY_CARE_PROVIDER_SITE_OTHER): Payer: BC Managed Care – PPO | Admitting: Family Medicine

## 2019-06-13 ENCOUNTER — Encounter: Payer: Self-pay | Admitting: Family Medicine

## 2019-06-13 ENCOUNTER — Other Ambulatory Visit: Payer: Self-pay

## 2019-06-13 VITALS — BP 132/84 | HR 74 | Ht 64.0 in | Wt 235.0 lb

## 2019-06-13 DIAGNOSIS — E785 Hyperlipidemia, unspecified: Secondary | ICD-10-CM | POA: Diagnosis not present

## 2019-06-13 DIAGNOSIS — I10 Essential (primary) hypertension: Secondary | ICD-10-CM

## 2019-06-13 DIAGNOSIS — Z6841 Body Mass Index (BMI) 40.0 and over, adult: Secondary | ICD-10-CM

## 2019-06-13 DIAGNOSIS — E559 Vitamin D deficiency, unspecified: Secondary | ICD-10-CM | POA: Diagnosis not present

## 2019-06-13 DIAGNOSIS — E039 Hypothyroidism, unspecified: Secondary | ICD-10-CM

## 2019-06-13 DIAGNOSIS — Z5181 Encounter for therapeutic drug level monitoring: Secondary | ICD-10-CM

## 2019-06-13 MED ORDER — NEBIVOLOL HCL 2.5 MG PO TABS: 7.5000 mg | ORAL_TABLET | Freq: Every day | ORAL | 5 refills | Status: DC

## 2019-06-13 MED ORDER — SYNTHROID 150 MCG PO TABS: 150.0000 ug | ORAL_TABLET | Freq: Every day | ORAL | 1 refills | Status: DC

## 2019-06-13 NOTE — Patient Instructions (Addendum)
Please start taking omega-3 fish oil again.  Ideally, I'd like for you to take 3000-4000 mg daily. If you cannot tolerate these, then there is a prescription one (won't have the fishy burps, but still is 4 pills/day).  We are increasing your Synthroid dose to 150 mcg.  You will be contacted to schedule another thyroid test in 6-8 weeks. You do not need to fast for that test.  Be sure to drink plenty of water, have a high fiber diet, and continue regular exercise in order to help treat your constipation.  Much of that is likely from the thyroid being under-treated, and should improve as the thyroid dose is corrected.  We are changing your Bystolic to the 2.5mg  tablet, since they are hard to cut.  Take THREE of these tablets daily, to get the same 7.5mg  dose that you have been taking.    High Triglycerides Eating Plan Triglycerides are a type of fat in the blood. High levels of triglycerides can increase your risk of heart disease and stroke. If your triglyceride levels are high, choosing the right foods can help lower your triglycerides and keep your heart healthy. Work with your health care provider or a diet and nutrition specialist (dietitian) to develop an eating plan that is right for you. What are tips for following this plan? General guidelines   Lose weight, if you are overweight. For most people, losing 5-10 lbs (2-5 kg) helps lower triglyceride levels. A weight-loss plan may include. ? 30 minutes of exercise at least 5 days a week. ? Reducing the amount of calories, sugar, and fat you eat.  Eat a wide variety of fresh fruits, vegetables, and whole grains. These foods are high in fiber.  Eat foods that contain healthy fats, such as fatty fish, nuts, seeds, and olive oil.  Avoid foods that are high in added sugar, added salt (sodium), saturated fat, and trans fat.  Avoid low-fiber, refined carbohydrates such as white bread, crackers, noodles, and white rice.  Avoid foods with  partially hydrogenated oils (trans fats), such as fried foods or stick margarine.  Limit alcohol intake to no more than 1 drink a day for nonpregnant women and 2 drinks a day for men. One drink equals 12 oz of beer, 5 oz of wine, or 1 oz of hard liquor. Your health care provider may recommend that you drink less depending on your overall health. Reading food labels  Check food labels for the amount of saturated fat. Choose foods with no or very little saturated fat.  Check food labels for the amount of trans fat. Choose foods with no trans fat.  Check food labels for the amount of cholesterol. Choose foods low in cholesterol. Ask your dietitian how much cholesterol you should have each day.  Check food labels for the amount of sodium. Choose foods with less than 140 milligrams (mg) per serving. Shopping  Buy dairy products labeled as nonfat (skim) or low-fat (1%).  Avoid buying processed or prepackaged foods. These are often high in added sugar, sodium, and fat. Cooking  Choose healthy fats when cooking, such as olive oil or canola oil.  Cook foods using lower fat methods, such as baking, broiling, boiling, or grilling.  Make your own sauces, dressings, and marinades when possible, instead of buying them. Store-bought sauces, dressings, and marinades are often high in sodium and sugar. Meal planning  Eat more home-cooked food and less restaurant, buffet, and fast food.  Eat fatty fish at least 2 times  each week. Examples of fatty fish include salmon, trout, mackerel, tuna, and herring.  If you eat whole eggs, do not eat more than 3 egg yolks per week. What foods are recommended? The items listed may not be a complete list. Talk with your dietitian about what dietary choices are best for you. Grains Whole wheat or whole grain breads, crackers, cereals, and pasta. Unsweetened oatmeal. Bulgur. Barley. Quinoa. Brown rice. Whole wheat flour tortillas. Vegetables Fresh or frozen  vegetables. Low-sodium canned vegetables. Fruits All fresh, canned (in natural juice), or frozen fruits. Meats and other protein foods Skinless chicken or [place]. Ground chicken or [place]. Lean cuts of pork, trimmed of fat. Fish and seafood, especially salmon, trout, and herring. Egg whites. Dried beans, peas, or lentils. Unsalted nuts or seeds. Unsalted canned beans. Natural peanut or almond butter. Dairy Low-fat dairy products. Skim or low-fat (1%) milk. Reduced fat (2%) and low-sodium cheese. Low-fat ricotta cheese. Low-fat cottage cheese. Plain, low-fat yogurt. Fats and oils Tub margarine without trans fats. Light or reduced-fat mayonnaise. Light or reduced-fat salad dressings. Avocado. Safflower, olive, sunflower, soybean, and canola oils. What foods are not recommended? The items listed may not be a complete list. Talk with your dietitian about what dietary choices are best for you. Grains White bread. White (regular) pasta. White rice. Cornbread. Bagels. Pastries. Crackers that contain trans fat. Vegetables Creamed or fried vegetables. Vegetables in a cheese sauce. Fruits Sweetened dried fruit. Canned fruit in syrup. Fruit juice. Meats and other protein foods Fatty cuts of meat. Ribs. Chicken wings. Berniece Salines. Sausage. Bologna. Salami. Chitterlings. Fatback. Hot dogs. Bratwurst. Packaged lunch meats. Dairy Whole or reduced-fat (2%) milk. Half-and-half. Cream cheese. Full-fat or sweetened yogurt. Full-fat cheese. Nondairy creamers. Whipped toppings. Processed cheese or cheese spreads. Cheese curds. Beverages Alcohol. Sweetened drinks, such as soda, lemonade, fruit drinks, or punches. Fats and oils Butter. Stick margarine. Lard. Shortening. Ghee. Bacon fat. Tropical oils, such as coconut, palm kernel, or palm oils. Sweets and desserts Corn syrup. Sugars. Honey. Molasses. Candy. Jam and jelly. Syrup. Sweetened cereals. Cookies. Pies. Cakes. Donuts. Muffins. Ice  cream. Condiments Store-bought sauces, dressings, and marinades that are high in sugar, such as ketchup and barbecue sauce. Summary  High levels of triglycerides can increase the risk of heart disease and stroke. Choosing the right foods can help lower your triglycerides.  Eat plenty of fresh fruits, vegetables, and whole grains. Choose low-fat dairy and lean meats. Eat fatty fish at least twice a week.  Avoid processed and prepackaged foods with added sugar, sodium, saturated fat, and trans fat.  If you need suggestions or have questions about what types of food are good for you, talk with your health care provider or a dietitian. This information is not intended to replace advice given to you by your health care provider. Make sure you discuss any questions you have with your health care provider. Document Revised: 05/01/2017 Document Reviewed: 07/22/2016 Elsevier Patient Education  2020 [employer].

## 2019-06-14 ENCOUNTER — Other Ambulatory Visit: Payer: Self-pay

## 2019-06-14 ENCOUNTER — Telehealth: Payer: Self-pay | Admitting: Internal Medicine

## 2019-06-14 DIAGNOSIS — I10 Essential (primary) hypertension: Secondary | ICD-10-CM

## 2019-06-14 MED ORDER — NEBIVOLOL HCL 5 MG PO TABS: 7.5000 mg | ORAL_TABLET | Freq: Every day | ORAL | 5 refills | Status: DC

## 2019-06-14 NOTE — Telephone Encounter (Signed)
Done KH 

## 2019-06-14 NOTE — Progress Notes (Signed)
Done

## 2019-06-14 NOTE — Telephone Encounter (Signed)
Okay to change back.  Please change back to bystolic 5mg , instructions of taking 1.5 tablets (7.5mg ) with quantity #135 with 1 refill.  If she has an issue with the quantity (90d supply), can change to #45 with 5 refills. Thanks

## 2019-06-14 NOTE — Telephone Encounter (Signed)
Pt called and states that the bystolic 2.5mg  (3 tablets) she is being charged 3 copays as its 90 tablets. Pt would like bystolic 5mg  (1.5 tablets) to be sent back in. She is completely out of meds.

## 2019-07-29 ENCOUNTER — Other Ambulatory Visit: Payer: Self-pay

## 2019-07-29 ENCOUNTER — Other Ambulatory Visit (INDEPENDENT_AMBULATORY_CARE_PROVIDER_SITE_OTHER): Payer: BC Managed Care – PPO

## 2019-07-29 DIAGNOSIS — E039 Hypothyroidism, unspecified: Secondary | ICD-10-CM

## 2019-07-29 DIAGNOSIS — Z5181 Encounter for therapeutic drug level monitoring: Secondary | ICD-10-CM

## 2019-07-30 LAB — TSH: TSH: 2.19 u[IU]/mL (ref 0.450–4.500)

## 2019-08-06 ENCOUNTER — Other Ambulatory Visit: Payer: Self-pay | Admitting: Family Medicine

## 2019-08-06 DIAGNOSIS — E039 Hypothyroidism, unspecified: Secondary | ICD-10-CM

## 2019-08-25 HISTORY — PX: KNEE ARTHROSCOPY: SUR90

## 2019-09-01 ENCOUNTER — Ambulatory Visit: Payer: BC Managed Care – PPO | Attending: Specialist | Admitting: Physical Therapy

## 2019-09-01 ENCOUNTER — Other Ambulatory Visit: Payer: Self-pay

## 2019-09-01 DIAGNOSIS — M25561 Pain in right knee: Secondary | ICD-10-CM | POA: Diagnosis not present

## 2019-09-01 DIAGNOSIS — M6281 Muscle weakness (generalized): Secondary | ICD-10-CM

## 2019-09-01 DIAGNOSIS — R262 Difficulty in walking, not elsewhere classified: Secondary | ICD-10-CM | POA: Insufficient documentation

## 2019-09-01 DIAGNOSIS — R600 Localized edema: Secondary | ICD-10-CM | POA: Diagnosis present
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
## 2019-09-01 NOTE — Addendum Note (Signed)
Addended by: Kearney Hard R on: 09/01/2019 10:05 AM   Modules accepted: Orders

## 2019-09-01 NOTE — Patient Instructions (Signed)
Access Code: [email or web address] URL: [email or web address] Date: 09/01/2019 Prepared by: Kearney Hard  Exercises Active Straight Leg Raise with Quad Set - 2 x daily - 7 x weekly - 2 sets - 10 reps Supine Bridge - 2 x daily - 7 x weekly - 2 sets - 10 reps - 5 seconds hold Sidelying Hip Abduction - 2 x daily - 7 x weekly - 2 sets - 10 reps - 3 seconds hold Mini Squat with Chair - 2 x daily - 7 x weekly - 2 sets - 10 reps

## 2019-09-08 ENCOUNTER — Encounter: Payer: Self-pay | Admitting: Physical Therapy

## 2019-09-08 ENCOUNTER — Ambulatory Visit: Payer: BC Managed Care – PPO | Admitting: Physical Therapy

## 2019-09-08 ENCOUNTER — Other Ambulatory Visit: Payer: Self-pay

## 2019-09-08 DIAGNOSIS — R262 Difficulty in walking, not elsewhere classified: Secondary | ICD-10-CM

## 2019-09-08 DIAGNOSIS — M25561 Pain in right knee: Secondary | ICD-10-CM | POA: Diagnosis not present

## 2019-09-08 DIAGNOSIS — R600 Localized edema: Secondary | ICD-10-CM

## 2019-09-08 DIAGNOSIS — M6281 Muscle weakness (generalized): Secondary | ICD-10-CM
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
## 2019-09-15 ENCOUNTER — Other Ambulatory Visit: Payer: Self-pay

## 2019-09-15 ENCOUNTER — Ambulatory Visit: Payer: BC Managed Care – PPO | Admitting: Physical Therapy

## 2019-09-15 ENCOUNTER — Encounter: Payer: Self-pay | Admitting: Physical Therapy

## 2019-09-15 DIAGNOSIS — M6281 Muscle weakness (generalized): Secondary | ICD-10-CM

## 2019-09-15 DIAGNOSIS — R262 Difficulty in walking, not elsewhere classified: Secondary | ICD-10-CM

## 2019-09-15 DIAGNOSIS — R600 Localized edema: Secondary | ICD-10-CM

## 2019-09-15 DIAGNOSIS — M25561 Pain in right knee: Secondary | ICD-10-CM
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
## 2019-09-20 ENCOUNTER — Other Ambulatory Visit: Payer: Self-pay | Admitting: Family Medicine

## 2019-09-20 DIAGNOSIS — I10 Essential (primary) hypertension: Secondary | ICD-10-CM

## 2019-09-20 NOTE — Telephone Encounter (Signed)
Has appt in october 

## 2019-09-22 ENCOUNTER — Ambulatory Visit: Payer: BC Managed Care – PPO | Admitting: Physical Therapy

## 2019-10-05 HISTORY — PX: KNEE ARTHROSCOPY: SUR90

## 2019-10-06 ENCOUNTER — Encounter: Payer: Self-pay | Admitting: *Deleted

## 2019-10-20 ENCOUNTER — Encounter: Payer: Self-pay | Admitting: *Deleted

## 2019-11-24 ENCOUNTER — Encounter: Payer: Self-pay | Admitting: Family Medicine

## 2019-11-28 ENCOUNTER — Other Ambulatory Visit: Payer: Self-pay | Admitting: Family Medicine

## 2019-11-28 DIAGNOSIS — I10 Essential (primary) hypertension: Secondary | ICD-10-CM

## 2019-11-28 NOTE — Telephone Encounter (Signed)
I did refill.  But she should be scheduling appt here to discuss her fatigue hair loss concerns (needs OV, not just lab visit as she requested--Sabrina told her this, but she hasn't scheduled).

## 2019-11-28 NOTE — Telephone Encounter (Signed)
Is this okay to refill and do you want me to change to the 2.5mg  to take 3 per day (you put that in your last note).

## 2019-11-28 NOTE — Telephone Encounter (Signed)
I called patient to make sure she saw the change in the medication change and see if she wanted to schedule appt. She said she would call back at the end of the week.

## 2019-12-22 ENCOUNTER — Other Ambulatory Visit: Payer: Self-pay | Admitting: Family Medicine

## 2019-12-22 DIAGNOSIS — Z1231 Encounter for screening mammogram for malignant neoplasm of breast: Secondary | ICD-10-CM

## 2019-12-23 ENCOUNTER — Ambulatory Visit: Payer: BC Managed Care – PPO

## 2019-12-28 ENCOUNTER — Other Ambulatory Visit: Payer: Self-pay

## 2019-12-28 ENCOUNTER — Ambulatory Visit
Admission: RE | Admit: 2019-12-28 | Discharge: 2019-12-28 | Disposition: A | Discharge status: Home / Self Care | Payer: BC Managed Care – PPO | Source: Ambulatory Visit | Attending: Family Medicine | Admitting: Family Medicine

## 2019-12-28 DIAGNOSIS — Z1231 Encounter for screening mammogram for malignant neoplasm of breast: Secondary | ICD-10-CM

## 2020-01-31 ENCOUNTER — Other Ambulatory Visit: Payer: Self-pay | Admitting: Family Medicine

## 2020-01-31 DIAGNOSIS — E039 Hypothyroidism, unspecified: Secondary | ICD-10-CM

## 2020-03-02 ENCOUNTER — Ambulatory Visit
Admission: RE | Admit: 2020-03-02 | Discharge: 2020-03-02 | Disposition: A | Discharge status: Home / Self Care | Payer: BC Managed Care – PPO | Source: Ambulatory Visit | Attending: Family Medicine | Admitting: Family Medicine

## 2020-03-02 ENCOUNTER — Other Ambulatory Visit: Payer: Self-pay

## 2020-03-02 DIAGNOSIS — Z1231 Encounter for screening mammogram for malignant neoplasm of breast: Secondary | ICD-10-CM

## 2020-03-19 ENCOUNTER — Other Ambulatory Visit: Payer: Self-pay | Admitting: Family Medicine

## 2020-03-19 DIAGNOSIS — I10 Essential (primary) hypertension: Secondary | ICD-10-CM

## 2020-03-25 NOTE — Patient Instructions (Addendum)
HEALTH MAINTENANCE RECOMMENDATIONS:  It is recommended that you get at least 30 minutes of aerobic exercise at least 5 days/week (for weight loss, you may need as much as 60-90 minutes). This can be any activity that gets your heart rate up. This can be divided in 10-15 minute intervals if needed, but try and build up your endurance at least once a week.  Weight bearing exercise is also recommended twice weekly.  Eat a healthy diet with lots of vegetables, fruits and fiber.  "Colorful" foods have a lot of vitamins (ie green vegetables, tomatoes, red peppers, etc).  Limit sweet tea, regular sodas and alcoholic beverages, all of which has a lot of calories and sugar.  Up to 1 alcoholic drink daily may be beneficial for women (unless trying to lose weight, watch sugars).  Drink a lot of water.  Calcium recommendations are 1200-1500 mg daily (1500 mg for postmenopausal women or women without ovaries), and vitamin D 1000 IU daily.  This should be obtained from diet and/or supplements (vitamins), and calcium should not be taken all at once, but in divided doses.  Monthly self breast exams and yearly mammograms for women over the age of 37 is recommended.  Sunscreen of at least SPF 30 should be used on all sun-exposed parts of the skin when outside between the hours of 10 am and 4 pm (not just when at beach or pool, but even with exercise, golf, tennis, and yard work!)  Use a sunscreen that says "broad spectrum" so it covers both UVA and UVB rays, and make sure to reapply every 1-2 hours.  Remember to change the batteries in your smoke detectors when changing your clock times in the spring and fall. Carbon monoxide detectors are recommended for your home.  Use your seat belt every time you are in a car, and please drive safely and not be distracted with cell phones and texting while driving.  I recommend getting the new shingles vaccine (Shingrix).  If you have commercial insurance, check with your  insurance to verify what your out of pocket cost may be (usually covered as preventative, but better to verify to avoid any surprises, as this vaccine is expensive), and then schedule a nurse visit at our office when convenient (based on the possible side effects as discussed).   This is a series of 2 injections, spaced 2 months apart.  It doesn't have to be exactly 2 months apart (but can't be under 2 months), if that isn't feasible for your schedule, but try and get them close to 2 months (and definitely within 6 months of each other, or else the efficacy of the vaccine drops off).  Your blood pressure was elevated today. Try and monitor it more regularly at home, goal being under 130/80.  If it is consistently 135-140/85-90 after a couple of months of getting back into regular exercise and working on weight loss, then return for a visit and bring your list of blood pressures. Continue to work on low sodium diet.  Please work hard on healthy food choices, low carb diet, portion control, resuming more regular exercise to help lose weight.  Of course, we will make sure that your thyroid is still being adequately treated and not contributing to metabolism issues causing gain.    DASH Eating Plan DASH stands for "Dietary Approaches to Stop Hypertension." The DASH eating plan is a healthy eating plan that has been shown to reduce high blood pressure (hypertension). It may also reduce your  risk for type 2 diabetes, heart disease, and stroke. The DASH eating plan may also help with weight loss. What are tips for following this plan?  General guidelines  Avoid eating more than 2,300 mg (milligrams) of salt (sodium) a day. If you have hypertension, you may need to reduce your sodium intake to 1,500 mg a day.  Limit alcohol intake to no more than 1 drink a day for nonpregnant women and 2 drinks a day for men. One drink equals 12 oz of beer, 5 oz of wine, or 1 oz of hard liquor.  Work with your health  care provider to maintain a healthy body weight or to lose weight. Ask what an ideal weight is for you.  Get at least 30 minutes of exercise that causes your heart to beat faster (aerobic exercise) most days of the week. Activities may include walking, swimming, or biking.  Work with your health care provider or diet and nutrition specialist (dietitian) to adjust your eating plan to your individual calorie needs. Reading food labels   Check food labels for the amount of sodium per serving. Choose foods with less than 5 percent of the Daily Value of sodium. Generally, foods with less than 300 mg of sodium per serving fit into this eating plan.  To find whole grains, look for the word "whole" as the first word in the ingredient list. Shopping  Buy products labeled as "low-sodium" or "no salt added."  Buy fresh foods. Avoid canned foods and premade or frozen meals. Cooking  Avoid adding salt when cooking. Use salt-free seasonings or herbs instead of table salt or sea salt. Check with your health care provider or pharmacist before using salt substitutes.  Do not fry foods. Cook foods using healthy methods such as baking, boiling, grilling, and broiling instead.  Cook with heart-healthy oils, such as olive, canola, soybean, or sunflower oil. Meal planning  Eat a balanced diet that includes: ? 5 or more servings of fruits and vegetables each day. At each meal, try to fill half of your plate with fruits and vegetables. ? Up to 6-8 servings of whole grains each day. ? Less than 6 oz of lean meat, poultry, or fish each day. A 3-oz serving of meat is about the same size as a deck of cards. One egg equals 1 oz. ? 2 servings of low-fat dairy each day. ? A serving of nuts, seeds, or beans 5 times each week. ? Heart-healthy fats. Healthy fats called Omega-3 fatty acids are found in foods such as flaxseeds and coldwater fish, like sardines, salmon, and mackerel.  Limit how much you eat of the  following: ? Canned or prepackaged foods. ? Food that is high in trans fat, such as fried foods. ? Food that is high in saturated fat, such as fatty meat. ? Sweets, desserts, sugary drinks, and other foods with added sugar. ? Full-fat dairy products.  Do not salt foods before eating.  Try to eat at least 2 vegetarian meals each week.  Eat more home-cooked food and less restaurant, buffet, and fast food.  When eating at a restaurant, ask that your food be prepared with less salt or no salt, if possible. What foods are recommended? The items listed may not be a complete list. Talk with your dietitian about what dietary choices are best for you. Grains Whole-grain or whole-wheat bread. Whole-grain or whole-wheat pasta. Brown rice. [place]. Bulgur. Whole-grain and low-sodium cereals. Pita bread. Low-fat, low-sodium crackers. Whole-wheat flour tortillas. Vegetables  Fresh or frozen vegetables (raw, steamed, roasted, or grilled). Low-sodium or reduced-sodium tomato and vegetable juice. Low-sodium or reduced-sodium tomato sauce and tomato paste. Low-sodium or reduced-sodium canned vegetables. Fruits All fresh, dried, or frozen fruit. Canned fruit in natural juice (without added sugar). Meat and other protein foods Skinless chicken or [place]. Ground chicken or [place]. Pork with fat trimmed off. Fish and seafood. Egg whites. Dried beans, peas, or lentils. Unsalted nuts, nut butters, and seeds. Unsalted canned beans. Lean cuts of beef with fat trimmed off. Low-sodium, lean deli meat. Dairy Low-fat (1%) or fat-free (skim) milk. Fat-free, low-fat, or reduced-fat cheeses. Nonfat, low-sodium ricotta or cottage cheese. Low-fat or nonfat yogurt. Low-fat, low-sodium cheese. Fats and oils Soft margarine without trans fats. Vegetable oil. Low-fat, reduced-fat, or light mayonnaise and salad dressings (reduced-sodium). Canola, safflower, olive, soybean, and sunflower oils. Avocado. Seasoning and other  foods Herbs. Spices. Seasoning mixes without salt. Unsalted popcorn and pretzels. Fat-free sweets. What foods are not recommended? The items listed may not be a complete list. Talk with your dietitian about what dietary choices are best for you. Grains Baked goods made with fat, such as croissants, muffins, or some breads. Dry pasta or rice meal packs. Vegetables Creamed or fried vegetables. Vegetables in a cheese sauce. Regular canned vegetables (not low-sodium or reduced-sodium). Regular canned tomato sauce and paste (not low-sodium or reduced-sodium). Regular tomato and vegetable juice (not low-sodium or reduced-sodium). Angie Fava. Olives. Fruits Canned fruit in a light or heavy syrup. Fried fruit. Fruit in cream or butter sauce. Meat and other protein foods Fatty cuts of meat. Ribs. Fried meat. Berniece Salines. Sausage. Bologna and other processed lunch meats. Salami. Fatback. Hotdogs. Bratwurst. Salted nuts and seeds. Canned beans with added salt. Canned or smoked fish. Whole eggs or egg yolks. Chicken or [place] with skin. Dairy Whole or 2% milk, cream, and half-and-half. Whole or full-fat cream cheese. Whole-fat or sweetened yogurt. Full-fat cheese. Nondairy creamers. Whipped toppings. Processed cheese and cheese spreads. Fats and oils Butter. Stick margarine. Lard. Shortening. Ghee. Bacon fat. Tropical oils, such as coconut, palm kernel, or palm oil. Seasoning and other foods Salted popcorn and pretzels. Onion salt, garlic salt, seasoned salt, table salt, and sea salt. Worcestershire sauce. Tartar sauce. Barbecue sauce. Teriyaki sauce. Soy sauce, including reduced-sodium. Steak sauce. Canned and packaged gravies. Fish sauce. Oyster sauce. Cocktail sauce. Horseradish that you find on the shelf. Ketchup. Mustard. Meat flavorings and tenderizers. Bouillon cubes. Hot sauce and Tabasco sauce. Premade or packaged marinades. Premade or packaged taco seasonings. Relishes. Regular salad dressings. Where to find  more information:  National Heart, Lung, and [place]: https://wilson-eaton.com/  American Heart Association: www.heart.org Summary  The DASH eating plan is a healthy eating plan that has been shown to reduce high blood pressure (hypertension). It may also reduce your risk for type 2 diabetes, heart disease, and stroke.  With the DASH eating plan, you should limit salt (sodium) intake to 2,300 mg a day. If you have hypertension, you may need to reduce your sodium intake to 1,500 mg a day.  When on the DASH eating plan, aim to eat more fresh fruits and vegetables, whole grains, lean proteins, low-fat dairy, and heart-healthy fats.  Work with your health care provider or diet and nutrition specialist (dietitian) to adjust your eating plan to your individual calorie needs. This information is not intended to replace advice given to you by your health care provider. Make sure you discuss any questions you have with your health care provider. Document Revised:  05/01/2017 Document Reviewed: 05/12/2016 Elsevier Patient Education  [place].
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
## 2020-03-26 ENCOUNTER — Other Ambulatory Visit: Payer: Self-pay

## 2020-03-26 ENCOUNTER — Ambulatory Visit (INDEPENDENT_AMBULATORY_CARE_PROVIDER_SITE_OTHER): Payer: BC Managed Care – PPO | Admitting: Family Medicine

## 2020-03-26 ENCOUNTER — Encounter: Payer: Self-pay | Admitting: Family Medicine

## 2020-03-26 VITALS — BP 138/92 | HR 80 | Temp 97.7°F | Ht 64.0 in | Wt 250.4 lb

## 2020-03-26 DIAGNOSIS — Z23 Encounter for immunization: Secondary | ICD-10-CM

## 2020-03-26 DIAGNOSIS — Z0000 Encounter for general adult medical examination without abnormal findings: Secondary | ICD-10-CM | POA: Diagnosis not present

## 2020-03-26 DIAGNOSIS — E785 Hyperlipidemia, unspecified: Secondary | ICD-10-CM | POA: Diagnosis not present

## 2020-03-26 DIAGNOSIS — I10 Essential (primary) hypertension: Secondary | ICD-10-CM | POA: Diagnosis not present

## 2020-03-26 DIAGNOSIS — E559 Vitamin D deficiency, unspecified: Secondary | ICD-10-CM

## 2020-03-26 DIAGNOSIS — Z5181 Encounter for therapeutic drug level monitoring: Secondary | ICD-10-CM

## 2020-03-26 DIAGNOSIS — E039 Hypothyroidism, unspecified: Secondary | ICD-10-CM

## 2020-03-26 DIAGNOSIS — J45998 Other asthma: Secondary | ICD-10-CM

## 2020-03-26 DIAGNOSIS — J302 Other seasonal allergic rhinitis: Secondary | ICD-10-CM

## 2020-03-26 DIAGNOSIS — R5383 Other fatigue: Secondary | ICD-10-CM

## 2020-03-26 LAB — POCT URINALYSIS DIP (PROADVANTAGE DEVICE)
Bilirubin, UA: NEGATIVE
Blood, UA: NEGATIVE
Glucose, UA: NEGATIVE mg/dL
Ketones, POC UA: NEGATIVE mg/dL
Leukocytes, UA: NEGATIVE
Nitrite, UA: NEGATIVE
Specific Gravity, Urine: 1.02
Urobilinogen, Ur: 0.2
pH, UA: 6 (ref 5.0–8.0)

## 2020-03-26 MED ORDER — ALBUTEROL SULFATE HFA 108 (90 BASE) MCG/ACT IN AERS: 2.0000 | INHALATION_SPRAY | Freq: Four times a day (QID) | RESPIRATORY_TRACT | 2 refills | Status: AC | PRN

## 2020-03-27 LAB — COMPREHENSIVE METABOLIC PANEL
ALT: 27 IU/L (ref 0–32)
AST: 21 IU/L (ref 0–40)
Albumin/Globulin Ratio: 1.8 (ref 1.2–2.2)
Albumin: 4.6 g/dL (ref 3.8–4.8)
Alkaline Phosphatase: 50 IU/L (ref 44–121)
BUN/Creatinine Ratio: 18 (ref 12–28)
BUN: 14 mg/dL (ref 8–27)
Bilirubin Total: 0.4 mg/dL (ref 0.0–1.2)
CO2: 27 mmol/L (ref 20–29)
Calcium: 9.9 mg/dL (ref 8.7–10.3)
Chloride: 101 mmol/L (ref 96–106)
Creatinine, Ser: 0.8 mg/dL (ref 0.57–1.00)
GFR calc Af Amer: 90 mL/min/{1.73_m2} (ref >59)
GFR calc non Af Amer: 78 mL/min/{1.73_m2} (ref >59)
Globulin, Total: 2.6 g/dL (ref 1.5–4.5)
Glucose: 96 mg/dL (ref 65–99)
Potassium: 4 mmol/L (ref 3.5–5.2)
Sodium: 144 mmol/L (ref 134–144)
Total Protein: 7.2 g/dL (ref 6.0–8.5)

## 2020-03-27 LAB — CBC WITH DIFFERENTIAL/PLATELET
Basophils Absolute: 0 10*3/uL (ref 0.0–0.2)
Basos: 1 %
EOS (ABSOLUTE): 0.2 10*3/uL (ref 0.0–0.4)
Eos: 3 %
Hematocrit: 45.2 % (ref 34.0–46.6)
Hemoglobin: 15.4 g/dL (ref 11.1–15.9)
Immature Grans (Abs): 0 10*3/uL (ref 0.0–0.1)
Immature Granulocytes: 0 %
Lymphocytes Absolute: 2.9 10*3/uL (ref 0.7–3.1)
Lymphs: 46 %
MCH: 33.7 pg — ABNORMAL HIGH (ref 26.6–33.0)
MCHC: 34.1 g/dL (ref 31.5–35.7)
MCV: 99 fL — ABNORMAL HIGH (ref 79–97)
Monocytes Absolute: 0.5 10*3/uL (ref 0.1–0.9)
Monocytes: 9 %
Neutrophils Absolute: 2.6 10*3/uL (ref 1.4–7.0)
Neutrophils: 41 %
Platelets: 241 10*3/uL (ref 150–450)
RBC: 4.57 x10E6/uL (ref 3.77–5.28)
RDW: 12.4 % (ref 11.7–15.4)
WBC: 6.2 10*3/uL (ref 3.4–10.8)

## 2020-03-27 LAB — VITAMIN D 25 HYDROXY (VIT D DEFICIENCY, FRACTURES): Vit D, 25-Hydroxy: 32.1 ng/mL (ref 30.0–100.0)

## 2020-03-27 LAB — LIPID PANEL
Chol/HDL Ratio: 4.5 ratio — ABNORMAL HIGH (ref 0.0–4.4)
Cholesterol, Total: 172 mg/dL (ref 100–199)
HDL: 38 mg/dL — ABNORMAL LOW (ref >39)
LDL Chol Calc (NIH): 87 mg/dL (ref 0–99)
Triglycerides: 283 mg/dL — ABNORMAL HIGH (ref 0–149)
VLDL Cholesterol Cal: 47 mg/dL — ABNORMAL HIGH (ref 5–40)

## 2020-03-27 LAB — TSH: TSH: 1.69 u[IU]/mL (ref 0.450–4.500)

## 2020-04-30 ENCOUNTER — Other Ambulatory Visit: Payer: Self-pay | Admitting: Family Medicine

## 2020-04-30 DIAGNOSIS — E039 Hypothyroidism, unspecified: Secondary | ICD-10-CM

## 2020-06-07 ENCOUNTER — Other Ambulatory Visit: Payer: BC Managed Care – PPO

## 2020-06-12 ENCOUNTER — Other Ambulatory Visit: Payer: Self-pay | Admitting: Family Medicine

## 2020-06-12 ENCOUNTER — Encounter: Payer: Self-pay | Admitting: Family Medicine

## 2020-06-12 DIAGNOSIS — I10 Essential (primary) hypertension: Secondary | ICD-10-CM

## 2020-06-12 MED ORDER — ALBUTEROL SULFATE (2.5 MG/3ML) 0.083% IN NEBU
2.5000 mg | INHALATION_SOLUTION | Freq: Four times a day (QID) | RESPIRATORY_TRACT | 0 refills | Status: AC | PRN
Start: 2020-06-12 — End: ?

## 2020-07-02 ENCOUNTER — Other Ambulatory Visit: Payer: Self-pay | Admitting: Family Medicine

## 2020-07-02 DIAGNOSIS — I10 Essential (primary) hypertension: Secondary | ICD-10-CM

## 2020-07-30 ENCOUNTER — Other Ambulatory Visit: Payer: Self-pay | Admitting: Family Medicine

## 2020-07-30 DIAGNOSIS — E039 Hypothyroidism, unspecified: Secondary | ICD-10-CM

## 2020-09-11 ENCOUNTER — Other Ambulatory Visit: Payer: Self-pay | Admitting: Family Medicine

## 2020-09-11 DIAGNOSIS — I10 Essential (primary) hypertension: Secondary | ICD-10-CM

## 2020-09-20 ENCOUNTER — Encounter: Payer: BC Managed Care – PPO | Admitting: Family Medicine
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
## 2020-09-24 ENCOUNTER — Ambulatory Visit: Payer: Medicare Other | Admitting: Family Medicine

## 2020-09-24 ENCOUNTER — Encounter: Payer: Self-pay | Admitting: Family Medicine

## 2020-09-24 ENCOUNTER — Other Ambulatory Visit: Payer: Self-pay

## 2020-09-24 ENCOUNTER — Telehealth: Payer: Self-pay

## 2020-09-24 VITALS — BP 130/92 | HR 76 | Ht 64.0 in | Wt 239.8 lb

## 2020-09-24 DIAGNOSIS — Z6841 Body Mass Index (BMI) 40.0 and over, adult: Secondary | ICD-10-CM

## 2020-09-24 DIAGNOSIS — E559 Vitamin D deficiency, unspecified: Secondary | ICD-10-CM | POA: Diagnosis not present

## 2020-09-24 DIAGNOSIS — E039 Hypothyroidism, unspecified: Secondary | ICD-10-CM

## 2020-09-24 DIAGNOSIS — E785 Hyperlipidemia, unspecified: Secondary | ICD-10-CM

## 2020-09-24 DIAGNOSIS — J302 Other seasonal allergic rhinitis: Secondary | ICD-10-CM

## 2020-09-24 DIAGNOSIS — Z5181 Encounter for therapeutic drug level monitoring: Secondary | ICD-10-CM

## 2020-09-24 DIAGNOSIS — I10 Essential (primary) hypertension: Secondary | ICD-10-CM

## 2020-09-24 MED ORDER — NEBIVOLOL HCL 2.5 MG PO TABS: 7.5000 mg | ORAL_TABLET | Freq: Every day | ORAL | 1 refills | Status: DC

## 2020-09-24 MED ORDER — LISINOPRIL-HYDROCHLOROTHIAZIDE 20-12.5 MG PO TABS: 2.0000 | ORAL_TABLET | Freq: Every day | ORAL | 1 refills | Status: DC

## 2020-09-24 NOTE — Telephone Encounter (Signed)
I scheduled pts. Welcome to medicare on 04/22/21 and her fasting labs on 04/17/21 for that apt. If you could put the orders in for that. Thank you.

## 2020-09-24 NOTE — Patient Instructions (Addendum)
Increase your fish oil to 2 capsules twice daily. Continue to limit fried/greasy foods, and sweets/junk, limit your portions of carbs and starches.  Eat plenty of vegetables, high fiber diet.  Move your calcium supplement to later in the day (try and have it 4 hours apart from your Synthroid).  Recheck your blood pressure at home and send me the value via MyChart.   High Triglycerides Eating Plan Triglycerides are a type of fat in the blood. High levels of triglycerides can increase your risk of heart disease and stroke. If your triglyceride levels are high, choosing the right foods can help lower your triglycerides and keep your heart healthy. Work with your health care provider or a diet and nutrition specialist (dietitian) to develop an eating plan that is right for you. What are tips for following this plan? General guidelines  Lose weight, if you are overweight. For most people, losing 5-10 lbs (2-5 kg) helps lower triglyceride levels. A weight-loss plan may include. ? 30 minutes of exercise at least 5 days a week. ? Reducing the amount of calories, sugar, and fat you eat.  Eat a wide variety of fresh fruits, vegetables, and whole grains. These foods are high in fiber.  Eat foods that contain healthy fats, such as fatty fish, nuts, seeds, and olive oil.  Avoid foods that are high in added sugar, added salt (sodium), saturated fat, and trans fat.  Avoid low-fiber, refined carbohydrates such as white bread, crackers, noodles, and white rice.  Avoid foods with partially hydrogenated oils (trans fats), such as fried foods or stick margarine.  Limit alcohol intake to no more than 1 drink a day for nonpregnant women and 2 drinks a day for men. One drink equals 12 oz of beer, 5 oz of wine, or 1 oz of hard liquor. Your health care provider may recommend that you drink less depending on your overall health.   Reading food labels  Check food labels for the amount of saturated fat. Choose  foods with no or very little saturated fat.  Check food labels for the amount of trans fat. Choose foods with no trans fat.  Check food labels for the amount of cholesterol. Choose foods low in cholesterol. Ask your dietitian how much cholesterol you should have each day.  Check food labels for the amount of sodium. Choose foods with less than 140 milligrams (mg) per serving. Shopping  Buy dairy products labeled as nonfat (skim) or low-fat (1%).  Avoid buying processed or prepackaged foods. These are often high in added sugar, sodium, and fat. Cooking  Choose healthy fats when cooking, such as olive oil or canola oil.  Cook foods using lower fat methods, such as baking, broiling, boiling, or grilling.  Make your own sauces, dressings, and marinades when possible, instead of buying them. Store-bought sauces, dressings, and marinades are often high in sodium and sugar. Meal planning  Eat more home-cooked food and less restaurant, buffet, and fast food.  Eat fatty fish at least 2 times each week. Examples of fatty fish include salmon, trout, mackerel, tuna, and herring.  If you eat whole eggs, do not eat more than 3 egg yolks per week. What foods are recommended? The items listed may not be a complete list. Talk with your dietitian about what dietary choices are best for you. Grains Whole wheat or whole grain breads, crackers, cereals, and pasta. Unsweetened oatmeal. Bulgur. Barley. Quinoa. Brown rice. Whole wheat flour tortillas. Vegetables Fresh or frozen vegetables. Low-sodium canned vegetables.  Fruits All fresh, canned (in natural juice), or frozen fruits. Meats and other protein foods Skinless chicken or [place]. Ground chicken or [place]. Lean cuts of pork, trimmed of fat. Fish and seafood, especially salmon, trout, and herring. Egg whites. Dried beans, peas, or lentils. Unsalted nuts or seeds. Unsalted canned beans. Natural peanut or almond butter. Dairy Low-fat dairy products.  Skim or low-fat (1%) milk. Reduced fat (2%) and low-sodium cheese. Low-fat ricotta cheese. Low-fat cottage cheese. Plain, low-fat yogurt. Fats and oils Tub margarine without trans fats. Light or reduced-fat mayonnaise. Light or reduced-fat salad dressings. Avocado. Safflower, olive, sunflower, soybean, and canola oils. What foods are not recommended? The items listed may not be a complete list. Talk with your dietitian about what dietary choices are best for you. Grains White bread. White (regular) pasta. White rice. Cornbread. Bagels. Pastries. Crackers that contain trans fat. Vegetables Creamed or fried vegetables. Vegetables in a cheese sauce. Fruits Sweetened dried fruit. Canned fruit in syrup. Fruit juice. Meats and other protein foods Fatty cuts of meat. Ribs. Chicken wings. Berniece Salines. Sausage. Bologna. Salami. Chitterlings. Fatback. Hot dogs. Bratwurst. Packaged lunch meats. Dairy Whole or reduced-fat (2%) milk. Half-and-half. Cream cheese. Full-fat or sweetened yogurt. Full-fat cheese. Nondairy creamers. Whipped toppings. Processed cheese or cheese spreads. Cheese curds. Beverages Alcohol. Sweetened drinks, such as soda, lemonade, fruit drinks, or punches. Fats and oils Butter. Stick margarine. Lard. Shortening. Ghee. Bacon fat. Tropical oils, such as coconut, palm kernel, or palm oils. Sweets and desserts Corn syrup. Sugars. Honey. Molasses. Candy. Jam and jelly. Syrup. Sweetened cereals. Cookies. Pies. Cakes. Donuts. Muffins. Ice cream. Condiments Store-bought sauces, dressings, and marinades that are high in sugar, such as ketchup and barbecue sauce. Summary  High levels of triglycerides can increase the risk of heart disease and stroke. Choosing the right foods can help lower your triglycerides.  Eat plenty of fresh fruits, vegetables, and whole grains. Choose low-fat dairy and lean meats. Eat fatty fish at least twice a week.  Avoid processed and prepackaged foods with added  sugar, sodium, saturated fat, and trans fat.  If you need suggestions or have questions about what types of food are good for you, talk with your health care provider or a dietitian. This information is not intended to replace advice given to you by your health care provider. Make sure you discuss any questions you have with your health care provider. Document Revised: 09/21/2019 Document Reviewed: 09/21/2019 Elsevier Patient Education  2021 [employer].

## 2020-10-26 ENCOUNTER — Other Ambulatory Visit: Payer: Self-pay | Admitting: Family Medicine

## 2020-10-26 DIAGNOSIS — E039 Hypothyroidism, unspecified: Secondary | ICD-10-CM

## 2021-04-17 ENCOUNTER — Other Ambulatory Visit: Payer: BC Managed Care – PPO

## 2021-04-22 ENCOUNTER — Ambulatory Visit: Payer: BC Managed Care – PPO | Admitting: Family Medicine

## 2021-05-01 ENCOUNTER — Other Ambulatory Visit: Payer: Self-pay | Admitting: Family Medicine

## 2021-05-01 DIAGNOSIS — Z1231 Encounter for screening mammogram for malignant neoplasm of breast: Secondary | ICD-10-CM

## 2021-06-07 ENCOUNTER — Ambulatory Visit
Admission: RE | Admit: 2021-06-07 | Discharge: 2021-06-07 | Disposition: A | Discharge status: Home / Self Care | Payer: Medicare PPO | Source: Ambulatory Visit | Attending: Family Medicine | Admitting: Family Medicine

## 2021-06-07 DIAGNOSIS — Z1231 Encounter for screening mammogram for malignant neoplasm of breast: Secondary | ICD-10-CM

## 2021-08-07 DIAGNOSIS — S99199A Other physeal fracture of unspecified metatarsal, initial encounter for closed fracture: Secondary | ICD-10-CM

## 2021-08-07 HISTORY — DX: Other physeal fracture of unspecified metatarsal, initial encounter for closed fracture: S99.199A

## 2021-08-13 ENCOUNTER — Other Ambulatory Visit: Payer: Self-pay | Admitting: Orthopaedic Surgery

## 2021-08-13 ENCOUNTER — Ambulatory Visit
Admission: RE | Admit: 2021-08-13 | Discharge: 2021-08-13 | Disposition: A | Discharge status: Home / Self Care | Payer: Medicare PPO | Source: Ambulatory Visit | Attending: Orthopaedic Surgery | Admitting: Orthopaedic Surgery

## 2021-08-13 ENCOUNTER — Other Ambulatory Visit: Payer: Self-pay

## 2021-08-13 DIAGNOSIS — T148XXA Other injury of unspecified body region, initial encounter: Secondary | ICD-10-CM

## 2021-08-14 ENCOUNTER — Encounter: Payer: Self-pay | Admitting: *Deleted

## 2022-06-12 ENCOUNTER — Other Ambulatory Visit: Payer: Self-pay | Admitting: Physician Assistant

## 2022-06-12 DIAGNOSIS — Z1231 Encounter for screening mammogram for malignant neoplasm of breast: Secondary | ICD-10-CM

## 2022-06-17 ENCOUNTER — Ambulatory Visit
Admission: RE | Admit: 2022-06-17 | Discharge: 2022-06-17 | Disposition: A | Discharge status: Home / Self Care | Payer: Medicare Other | Source: Ambulatory Visit | Attending: Physician Assistant | Admitting: Physician Assistant

## 2022-06-17 DIAGNOSIS — Z1231 Encounter for screening mammogram for malignant neoplasm of breast: Secondary | ICD-10-CM

## 2022-09-28 ENCOUNTER — Encounter (HOSPITAL_BASED_OUTPATIENT_CLINIC_OR_DEPARTMENT_OTHER): Payer: Self-pay | Admitting: Emergency Medicine

## 2022-09-28 ENCOUNTER — Emergency Department (HOSPITAL_BASED_OUTPATIENT_CLINIC_OR_DEPARTMENT_OTHER)
Admission: EM | Admit: 2022-09-28 | Discharge: 2022-09-28 | Disposition: A | Discharge status: Home / Self Care | Payer: Medicare Other | Attending: Emergency Medicine | Admitting: Emergency Medicine

## 2022-09-28 ENCOUNTER — Emergency Department (HOSPITAL_BASED_OUTPATIENT_CLINIC_OR_DEPARTMENT_OTHER): Payer: Medicare Other

## 2022-09-28 ENCOUNTER — Emergency Department (HOSPITAL_BASED_OUTPATIENT_CLINIC_OR_DEPARTMENT_OTHER): Payer: Medicare Other | Admitting: Radiology

## 2022-09-28 DIAGNOSIS — I10 Essential (primary) hypertension: Secondary | ICD-10-CM | POA: Insufficient documentation

## 2022-09-28 DIAGNOSIS — Z7982 Long term (current) use of aspirin: Secondary | ICD-10-CM | POA: Insufficient documentation

## 2022-09-28 DIAGNOSIS — E039 Hypothyroidism, unspecified: Secondary | ICD-10-CM | POA: Insufficient documentation

## 2022-09-28 DIAGNOSIS — I491 Atrial premature depolarization: Secondary | ICD-10-CM | POA: Diagnosis not present

## 2022-09-28 DIAGNOSIS — R002 Palpitations: Secondary | ICD-10-CM | POA: Diagnosis present

## 2022-09-28 DIAGNOSIS — Z79899 Other long term (current) drug therapy: Secondary | ICD-10-CM | POA: Insufficient documentation

## 2022-09-28 DIAGNOSIS — J45909 Unspecified asthma, uncomplicated: Secondary | ICD-10-CM | POA: Diagnosis not present

## 2022-09-28 DIAGNOSIS — R202 Paresthesia of skin: Secondary | ICD-10-CM | POA: Diagnosis not present

## 2022-09-28 LAB — CBC WITH DIFFERENTIAL/PLATELET
Abs Immature Granulocytes: 0.02 10*3/uL (ref 0.00–0.07)
Basophils Absolute: 0 10*3/uL (ref 0.0–0.1)
Basophils Relative: 1 %
Eosinophils Absolute: 0.2 10*3/uL (ref 0.0–0.5)
Eosinophils Relative: 3 %
HCT: 43.1 % (ref 36.0–46.0)
Hemoglobin: 15.2 g/dL — ABNORMAL HIGH (ref 12.0–15.0)
Immature Granulocytes: 0 %
Lymphocytes Relative: 46 %
Lymphs Abs: 3.1 10*3/uL (ref 0.7–4.0)
MCH: 33.9 pg (ref 26.0–34.0)
MCHC: 35.3 g/dL (ref 30.0–36.0)
MCV: 96 fL (ref 80.0–100.0)
Monocytes Absolute: 0.4 10*3/uL (ref 0.1–1.0)
Monocytes Relative: 7 %
Neutro Abs: 2.9 10*3/uL (ref 1.7–7.7)
Neutrophils Relative %: 43 %
Platelets: 238 10*3/uL (ref 150–400)
RBC: 4.49 MIL/uL (ref 3.87–5.11)
RDW: 12.7 % (ref 11.5–15.5)
WBC: 6.7 10*3/uL (ref 4.0–10.5)
nRBC: 0 % (ref 0.0–0.2)

## 2022-09-28 LAB — COMPREHENSIVE METABOLIC PANEL
ALT: 24 U/L (ref 0–44)
AST: 19 U/L (ref 15–41)
Albumin: 4.5 g/dL (ref 3.5–5.0)
Alkaline Phosphatase: 39 U/L (ref 38–126)
Anion gap: 12 (ref 5–15)
BUN: 19 mg/dL (ref 8–23)
CO2: 27 mmol/L (ref 22–32)
Calcium: 9.6 mg/dL (ref 8.9–10.3)
Chloride: 102 mmol/L (ref 98–111)
Creatinine, Ser: 0.8 mg/dL (ref 0.44–1.00)
GFR, Estimated: >60 mL/min (ref >60)
Glucose, Bld: 107 mg/dL — ABNORMAL HIGH (ref 70–99)
Potassium: 3.3 mmol/L — ABNORMAL LOW (ref 3.5–5.1)
Sodium: 141 mmol/L (ref 135–145)
Total Bilirubin: 0.3 mg/dL (ref 0.3–1.2)
Total Protein: 7.3 g/dL (ref 6.5–8.1)

## 2022-09-28 LAB — TROPONIN I (HIGH SENSITIVITY): Troponin I (High Sensitivity): 6 ng/L (ref <18)

## 2022-09-28 MED ORDER — POTASSIUM CHLORIDE CRYS ER 20 MEQ PO TBCR
40.0000 meq | EXTENDED_RELEASE_TABLET | Freq: Once | ORAL | Status: AC
  Administered 2022-09-28: 40 meq via ORAL
  Filled 2022-09-28: qty 2

## 2022-09-28 NOTE — ED Triage Notes (Signed)
Pt arrives pov, slow gait, endorses "feeling bad for a couple weeks, explained as fatigue".  Pt c/o intermittent palpitations that "feels like heart flipping" for "couple months". Pt denies CP or shob. Reports RT side face numbness/tingling at 0630 upon waking. Last normal was 11pm last night. Speech clear. No facial droop noted, bilaterally equal. Pt denies HA

## 2022-09-28 NOTE — ED Provider Notes (Signed)
[facility] Provider Note   CSN: [id number] Arrival date & time: 09/28/22  7829     History {Add pertinent medical, surgical, social history, OB history to HPI:1} Chief Complaint  Patient presents with   Palpitations    Tamara Rosales is a 67 y.o. female.  HPI     67yo female history of htn, hypertriglceridemia, migraine, asthma  Palpitations, feels like flopping, has occurred in the past but more consistent today. In the past would have it, maybe cough a few times, take a deep breath and it would get bettert.  Sometimes feels like racing, but not all the time.  Last few days has been more frequent. Today more constant.   Now feeling it a little bit, coming and going.  Woke up this morning with tingling of right side of the face at 630, went to bed 11PM without symptoms.  No numbness, just tingling.     Denies numbness, weakness, difficulty talking or walking, visual changes or facial droop.      No chest pain or dyspnea No headache   One day this past week for a few seconds had pain and then it improved, Wednesday am  No fever, neausea vomiting, diarrhea, new meds    Past Medical History:  Diagnosis Date   Allergy    Asthma    Hypertension    Hypertriglyceridemia    Jones fracture 08/07/2021   base of 5th metarsal   Migraine    Unspecified hypothyroidism     Home Medications Prior to Admission medications   Medication Sig Start Date End Date Taking? Authorizing Provider  albuterol (PROVENTIL) (2.5 MG/3ML) 0.083% nebulizer solution Take 3 mLs (2.5 mg total) by nebulization every 6 (six) hours as needed for wheezing or shortness of breath. Patient not taking: Reported on 09/24/2020 06/12/20   Joselyn Arrow, MD  albuterol (VENTOLIN HFA) 108 (90 Base) MCG/ACT inhaler Inhale 2 puffs into the lungs every 6 (six) hours as needed for wheezing or shortness of breath. Reported on 12/19/2015 Patient not taking: Reported on  09/24/2020 03/26/20   Joselyn Arrow, MD  aspirin EC 81 MG tablet Take 81 mg by mouth daily.    [provider]  Calcium Carbonate-Vitamin D (CALCIUM-VITAMIN D) 500-200 MG-UNIT per tablet Take 1 tablet by mouth daily.    [provider]  cholecalciferol (VITAMIN D3) 25 MCG (1000 UNIT) tablet Take 1,000 Units by mouth daily.    [provider]  fexofenadine (ALLEGRA) 180 MG tablet Take 180 mg by mouth daily. Reported on 12/19/2015    [provider]  lisinopril-hydrochlorothiazide (ZESTORETIC) 20-12.5 MG tablet Take 2 tablets by mouth daily. 09/24/20   Joselyn Arrow, MD  nebivolol (BYSTOLIC) 2.5 MG tablet Take 3 tablets (7.5 mg total) by mouth daily. 09/24/20   Joselyn Arrow, MD  Omega-3 Fatty Acids (FISH OIL) 1000 MG CPDR Take 2 capsules by mouth daily.    [provider]  SYNTHROID 150 MCG tablet TAKE 1 TABLET BY MOUTH ONCE DAILY BEFORE BREAKFAST 10/29/20   Joselyn Arrow, MD      Allergies    Oxycodone    Review of Systems   Review of Systems  Physical Exam Updated Vital Signs BP 125/74   Pulse 66   Temp 98.4 F (36.9 C) (Oral)   Resp (!) 22   Ht 5\' 4"  (1.626 m)   Wt 104.3 kg   SpO2 97%   BMI 39.48 kg/m  Physical Exam  ED Results /  Procedures / Treatments   Labs (all labs ordered are listed, but only abnormal results are displayed) Labs Reviewed  CBC WITH DIFFERENTIAL/PLATELET - Abnormal; Notable for the following components:      Result Value   Hemoglobin 15.2 (*)    All other components within normal limits  COMPREHENSIVE METABOLIC PANEL - Abnormal; Notable for the following components:   Potassium 3.3 (*)    Glucose, Bld 107 (*)    All other components within normal limits    EKG None  Radiology CT Head Wo Contrast  Result Date: 09/28/2022 CLINICAL DATA:  Numbness or tingling with paresthesia. EXAM: CT HEAD WITHOUT CONTRAST TECHNIQUE: Contiguous axial images were obtained from the base of the skull through the vertex without  intravenous contrast. RADIATION DOSE REDUCTION: This exam was performed according to the departmental dose-optimization program which includes automated exposure control, adjustment of the mA and/or kV according to patient size and/or use of iterative reconstruction technique. COMPARISON:  Head CT 02/03/2015 FINDINGS: Brain: No evidence of acute infarction, hemorrhage, hydrocephalus, extra-axial collection or mass lesion/mass effect. 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 Vascular: No hyperdense vessel or unexpected calcification. Skull: Normal. Negative for fracture or focal lesion. Sinuses/Orbits: No acute finding. IMPRESSION: No acute or interval finding. Electronically Signed   By: Tiburcio Pea M.D.   On: 09/28/2022 12:02    Procedures Procedures  {Document cardiac monitor, telemetry assessment procedure when appropriate:1}  Medications Ordered in ED Medications - No data to display  ED Course/ Medical Decision Making/ A&P   {   Click here for ABCD2, HEART and other calculatorsREFRESH Note before signing :1}                          Medical Decision Making Amount and/or Complexity of Data Reviewed Labs: ordered. Radiology: ordered.   ***  {Document critical care time when appropriate:1} {Document review of labs and clinical decision tools ie heart score, Chads2Vasc2 etc:1}  {Document your independent review of radiology images, and any outside records:1} {Document your discussion with family members, caretakers, and with consultants:1} {Document social determinants of health affecting pt's care:1} {Document your decision making why or why not admission, treatments were needed:1} Final Clinical Impression(s) / ED Diagnoses Final diagnoses:  None    Rx / DC Orders ED Discharge Orders     None

## 2022-09-28 NOTE — ED Notes (Signed)
Pt discharged to home using teachback Method. Discharge instructions have been discussed with patient and/or family members. Pt verbally acknowledges understanding d/c instructions, has been given opportunity for questions to be answered, and endorses comprehension to checkout at registration before leaving.  

## 2022-10-08 ENCOUNTER — Ambulatory Visit (HOSPITAL_BASED_OUTPATIENT_CLINIC_OR_DEPARTMENT_OTHER): Payer: Medicare Other | Admitting: Cardiology

## 2022-11-28 ENCOUNTER — Other Ambulatory Visit (HOSPITAL_BASED_OUTPATIENT_CLINIC_OR_DEPARTMENT_OTHER): Payer: Medicare Other

## 2022-11-28 ENCOUNTER — Encounter (HOSPITAL_BASED_OUTPATIENT_CLINIC_OR_DEPARTMENT_OTHER): Payer: Self-pay | Admitting: Cardiology

## 2022-11-28 ENCOUNTER — Ambulatory Visit (HOSPITAL_BASED_OUTPATIENT_CLINIC_OR_DEPARTMENT_OTHER): Payer: Medicare Other | Admitting: Cardiology

## 2022-11-28 VITALS — BP 138/88 | HR 77 | Ht 64.0 in | Wt 248.8 lb

## 2022-11-28 DIAGNOSIS — Z09 Encounter for follow-up examination after completed treatment for conditions other than malignant neoplasm: Secondary | ICD-10-CM

## 2022-11-28 DIAGNOSIS — R002 Palpitations: Secondary | ICD-10-CM

## 2022-11-28 DIAGNOSIS — I491 Atrial premature depolarization: Secondary | ICD-10-CM

## 2022-11-28 DIAGNOSIS — I10 Essential (primary) hypertension: Secondary | ICD-10-CM | POA: Diagnosis not present

## 2022-11-28 DIAGNOSIS — Z7189 Other specified counseling: Secondary | ICD-10-CM

## 2022-11-28 NOTE — Patient Instructions (Signed)
Medication Instructions:  Your physician recommends that you continue on your current medications as directed. Please refer to the Current Medication list given to you today.  *If you need a refill on your cardiac medications before your next appointment, please call your pharmacy*   Testing/Procedures: Your physician has recommended that you wear an event monitor. Event monitors are medical devices that record the heart's electrical activity. Doctors most often [place] these monitors to diagnose arrhythmias. Arrhythmias are problems with the speed or rhythm of the heartbeat. The monitor is a small, portable device. You can wear one while you do your normal daily activities. This is usually used to diagnose what is causing palpitations/syncope (passing out).    Follow-Up: At [facility], you and your health needs are our priority.  As part of our continuing mission to provide you with exceptional heart care, we have created designated Provider Care Teams.  These Care Teams include your primary Cardiologist (physician) and Advanced Practice Providers (APPs -  Physician Assistants and Nurse Practitioners) who all work together to provide you with the care you need, when you need it.   Your next appointment:   To be determined after event monitor  Provider:   Jodelle Red, MD    Other Instructions ZIO XT- Long Term Monitor Instructions  Your physician has requested you wear a ZIO patch monitor for 14 days.  This is a single patch monitor. [employer] supplies one patch monitor per enrollment. Additional stickers are not available. Please do not apply patch if you will be having a Nuclear Stress Test,  Echocardiogram, Cardiac CT, MRI, or Chest Xray during the period you would be wearing the  monitor. The patch cannot be worn during these tests. You cannot remove and re-apply the  ZIO XT patch monitor.  Your ZIO patch monitor will be mailed 3 day USPS to your address on file. It may  take 3-5 days  to receive your monitor after you have been enrolled.  Once you have received your monitor, please review the enclosed instructions. Your monitor  has already been registered assigning a specific monitor serial # to you.  Billing and Patient Assistance Program Information  We have supplied [employer] with any of your insurance information on file for billing purposes. [employer] offers a sliding scale Patient Assistance Program for patients that do not have  insurance, or whose insurance does not completely cover the cost of the ZIO monitor.  You must apply for the Patient Assistance Program to qualify for this discounted rate.  To apply, please call [employer] at [phone], select option 4, select option 2, ask to apply for  Patient Assistance Program. Meredeth Ide will ask your household income, and how many people  are in your household. They will quote your out-of-pocket cost based on that information.  [employer] will also be able to set up a 81-month, interest-free payment plan if needed.  Applying the monitor   Shave hair from upper left chest.  Hold abrader disc by orange tab. Rub abrader in 40 strokes over the upper left chest as  indicated in your monitor instructions.  Clean area with 4 enclosed alcohol pads. Let dry.  Apply patch as indicated in monitor instructions. Patch will be placed under collarbone on left  side of chest with arrow pointing upward.  Rub patch adhesive wings for 2 minutes. Remove white label marked "1". Remove the white  label marked "2". Rub patch adhesive wings for 2 additional minutes.  While looking in a mirror,  press and release button in center of patch. A small green light will  flash 3-4 times. This will be your only indicator that the monitor has been turned on.  Do not shower for the first 24 hours. You may shower after the first 24 hours.  Press the button if you feel a symptom. You will hear a small click. Record Date, Time and  Symptom in  the Patient Logbook.  When you are ready to remove the patch, follow instructions on the last 2 pages of Patient  Logbook. Stick patch monitor onto the last page of Patient Logbook.  Place Patient Logbook in the blue and white box. Use locking tab on box and tape box closed  securely. The blue and white box has prepaid postage on it. Please place it in the mailbox as  soon as possible. Your physician should have your test results approximately 7 days after the  monitor has been mailed back to [facility].  Call [facility] Customer Care at [phone] if you have questions regarding  your ZIO XT patch monitor. Call them immediately if you see an orange light blinking on your  monitor.  If your monitor falls off in less than 4 days, contact our Monitor department at [phone].  If your monitor becomes loose or falls off after 4 days call [employer] at [phone] for  suggestions on securing your monitor

## 2022-11-28 NOTE — Progress Notes (Signed)
Cardiology Office Note:  .   Date:  11/28/2022  ID:  Tamara Rosales, DOB 1956/04/21, MRN [id number] PCP: Exie Parody  [facility] Providers Cardiologist:  Jodelle Red, MD {  History of Present Illness: .   Tamara Rosales is a 67 y.o. female with PMH hypertension, hyperlipidemia who is seen as a new patient for palpitations.  ER note from 09/28/22 reviewed. Noted frequent PACs on telemetry per note.  Tachycardia/palpitations: -Initial onset: Started early April. On 4/28, symptoms were nonstop, face was tingling, felt short of breath. Went to the ER for further evaluation. Typically only lasts a few seconds, but on that day was happening every few minutes. More recently, has been occurring once every few days. Has improved with increasing her potassium intake.  -Prior cardiac history: none -Caffeine: none (caffeine gives her a migraine) -Alcohol: none -Tobacco: none -Exercise level: working to increase her walking, now up to 5000 steps/day. -Labs: TSH, kidney function/electrolytes, CBC reviewed. -Cardiac ROS: no chest pain, no shortness of breath, no PND, no orthopnea, no LE edema. -Family history:  father had Mis, first age 100, had bypass surgery around age 52. Brother has a pacemaker.  ROS: Denies chest pain, shortness of breath at rest or with normal exertion. No PND, orthopnea, LE edema or unexpected weight gain. No syncope or palpitations. ROS otherwise negative except as noted.   Studies Reviewed: Marland Kitchen    EKG:     not ordered today  Physical Exam:   VS:  BP (!) 148/90   Pulse 77   Ht 5\' 4"  (1.626 m)   Wt 248 lb 12.8 oz (112.9 kg)   SpO2 96%   BMI 42.71 kg/m    Wt Readings from Last 3 Encounters:  11/28/22 248 lb 12.8 oz (112.9 kg)  09/28/22 230 lb (104.3 kg)  09/24/20 239 lb 12.8 oz (108.8 kg)    GEN: Well nourished, well developed in no acute distress HEENT: Normal, moist mucous membranes NECK: No JVD CARDIAC: regular rhythm,  normal S1 and S2, no rubs or gallops. No murmur. VASCULAR: Radial and DP pulses 2+ bilaterally. No carotid bruits RESPIRATORY:  Clear to auscultation without rales, wheezing or rhonchi  ABDOMEN: Soft, non-tender, non-distended MUSCULOSKELETAL:  Ambulates independently SKIN: Warm and dry, no edema NEUROLOGIC:  Alert and oriented x 3. No focal neuro deficits noted. PSYCHIATRIC:  Normal affect    ASSESSMENT AND PLAN: .   Palpitations PACs -ER visit and workup reviewed. -we discussed the potential causes of fast heart rates and palpitations today. Reviewed the normal electrical system of the heart. Reviewed the role of the sinus node. Reviewed the balance between resting (vagal) tone and fight or flight nervous system input. Reviewed how exercise improves vagal tone and lowers resting heart rate. Reviewed that sinus tachycardia, or elevated sinus rate, is usually secondary to something else in the body. This can include pain, stress, infection, anxiety, hormone imbalance, low blood counts, etc. Discussed that we do not typically treat sinus tachycardia by itself, and instead the focus is on finding what is driving the heart rate and treating that. We discussed that there can be other rhythm issues, from either the top or bottom of the heart, that are abnormal rhythms. Discussed how we evaluate for these.  -discussed Kardia today, do not think this is a great fit given how brief her symptoms are -discussed Zio today. She is amenable.  Hypertension -elevated initially today -continue lisinopril-hydrochlorothiazide, nebivolol  Obesity -BMI 42  CV risk  counseling and prevention -recommend heart healthy/Mediterranean diet, with whole grains, fruits, vegetable, fish, lean meats, nuts, and olive oil. Limit salt. -recommend moderate walking, 3-5 times/week for 30-50 minutes each session. Aim for at least 150 minutes.week. Goal should be pace of 3 miles/hours, or walking 1.5 miles in 30  minutes -recommend avoidance of tobacco products. Avoid excess alcohol. -ASCVD risk score: The 10-year ASCVD risk score (Arnett DK, et al., 2019) is: 12.8%   Values used to calculate the score:     Age: 15 years     Sex: Female     Is Non-Hispanic African American: No     Diabetic: No     Tobacco smoker: No     Systolic Blood Pressure: 148 mmHg     Is BP treated: Yes     HDL Cholesterol: 38 mg/dL     Total Cholesterol: 172 mg/dL   Dispo: to be determined based on monitor results  Signed, Jodelle Red, MD   Jodelle Red, MD, PhD, [facility]  [facility]  [place]  [facility] at [facility] at [facility] [address], [address] [place], [place] [address] [phone]

## 2022-12-05 ENCOUNTER — Encounter (HOSPITAL_BASED_OUTPATIENT_CLINIC_OR_DEPARTMENT_OTHER): Payer: Self-pay

## 2023-01-14 ENCOUNTER — Encounter (HOSPITAL_BASED_OUTPATIENT_CLINIC_OR_DEPARTMENT_OTHER): Payer: Self-pay

## 2023-01-15 NOTE — Telephone Encounter (Signed)
Monitor resulted 6/28, please advise

## 2023-01-21 ENCOUNTER — Ambulatory Visit: Payer: Medicare Other | Admitting: Nurse Practitioner

## 2023-01-21 ENCOUNTER — Encounter: Payer: Self-pay | Admitting: Nurse Practitioner

## 2023-01-21 VITALS — BP 153/90 | HR 63 | Temp 98.1°F | Ht 64.0 in | Wt 244.0 lb

## 2023-01-21 DIAGNOSIS — Z6841 Body Mass Index (BMI) 40.0 and over, adult: Secondary | ICD-10-CM

## 2023-01-21 DIAGNOSIS — I10 Essential (primary) hypertension: Secondary | ICD-10-CM | POA: Diagnosis not present

## 2023-01-21 DIAGNOSIS — E039 Hypothyroidism, unspecified: Secondary | ICD-10-CM | POA: Diagnosis not present

## 2023-01-21 NOTE — Progress Notes (Signed)
Office: [phone]  /  Fax: [phone]   Initial Visit  Tamara Rosales was seen in clinic today to evaluate for obesity. She is interested in losing weight to improve overall health and reduce the risk of weight related complications. She presents today to review program treatment options, initial physical assessment, and evaluation.     She was referred by: Friend or Family  When asked what else they would like to accomplish? She states: Adopt healthier eating patterns, Improve quality of life, Improve appearance, and Lose a target amount of weight : Goal weight 180 lbs.  She would love to lose weight to be able to wear her wedding ring.   Weight history:  She started gaining weight as a child. She started gaining excess when she broke her foot and after having surgery.    When asked how has your weight affected you? She states: Contributed to orthopedic problems or mobility issues, Having fatigue, and Having poor endurance  Some associated conditions: HTN, allergies, asthma, hypothyroidism   Contributing factors: Family history and Menopause  Weight promoting medications identified: Steroids  Current nutrition plan: None  Current level of physical activity: Walking  Current or previous pharmacotherapy: Phentermine  Response to medication: Lost weight initially but was unable to sustain weight loss   Past medical history includes:   Past Medical History:  Diagnosis Date   Allergy    Asthma    Hypertension    Hypertriglyceridemia    Jones fracture 08/07/2021   base of 5th metarsal   Migraine    Unspecified hypothyroidism      Objective:   BP (!) 175/93   Pulse 63   Temp 98.1 F (36.7 C)   Ht 5\' 4"  (1.626 m)   Wt 244 lb (110.7 kg)   SpO2 96%   BMI 41.88 kg/m  She was weighed on the bioimpedance scale: Body mass index is 41.88 kg/m.  Peak Weight:250 lbs , Body Fat%:49%, Visceral Fat Rating:17, Weight trend over the last 12 months:  fluctuatedv  General:  Alert, oriented and cooperative. Patient is in no acute distress.  Respiratory: Normal respiratory effort, no problems with respiration noted   Gait: able to ambulate independently  Mental Status: Normal mood and affect. Normal behavior. Normal judgment and thought content.   DIAGNOSTIC DATA REVIEWED:  BMET    Component Value Date/Time   NA 141 09/28/2022 1027   NA 144 03/26/2020 1038   K 3.3 (L) 09/28/2022 1027   CL 102 09/28/2022 1027   CO2 27 09/28/2022 1027   GLUCOSE 107 (H) 09/28/2022 1027   BUN 19 09/28/2022 1027   BUN 14 03/26/2020 1038   CREATININE 0.80 09/28/2022 1027   CREATININE 0.65 05/07/2017 1115   CALCIUM 9.6 09/28/2022 1027   GFRNONAA >60 09/28/2022 1027   GFRAA 90 03/26/2020 1038   No results found for: "HGBA1C" No results found for: "INSULIN" CBC    Component Value Date/Time   WBC 6.7 09/28/2022 1027   RBC 4.49 09/28/2022 1027   HGB 15.2 (H) 09/28/2022 1027   HGB 15.4 03/26/2020 1038   HCT 43.1 09/28/2022 1027   HCT 45.2 03/26/2020 1038   PLT 238 09/28/2022 1027   PLT 241 03/26/2020 1038   MCV 96.0 09/28/2022 1027   MCV 99 (H) 03/26/2020 1038   MCH 33.9 09/28/2022 1027   MCHC 35.3 09/28/2022 1027   RDW 12.7 09/28/2022 1027   RDW 12.4 03/26/2020 1038   Iron/TIBC/Ferritin/ %Sat No results found for: "IRON", "TIBC", "FERRITIN", "  IRONPCTSAT" Lipid Panel     Component Value Date/Time   CHOL 172 03/26/2020 1038   TRIG 283 (H) 03/26/2020 1038   HDL 38 (L) 03/26/2020 1038   CHOLHDL 4.5 (H) 03/26/2020 1038   CHOLHDL 4.5 05/07/2017 1115   VLDL 32 (H) 12/19/2015 1131   LDLCALC 87 03/26/2020 1038   LDLCALC 111 (H) 05/07/2017 1115   Hepatic Function Panel     Component Value Date/Time   PROT 7.3 09/28/2022 1027   PROT 7.2 03/26/2020 1038   ALBUMIN 4.5 09/28/2022 1027   ALBUMIN 4.6 03/26/2020 1038   AST 19 09/28/2022 1027   ALT 24 09/28/2022 1027   ALKPHOS 39 09/28/2022 1027   BILITOT 0.3 09/28/2022 1027   BILITOT 0.4  03/26/2020 1038      Component Value Date/Time   TSH 1.690 03/26/2020 1038     Assessment and Plan:   Essential hypertension, benign Continue to follow up with PCP.  Continue meds as directed  Hypothyroidism, unspecified type Continue to follow up with PCP.  Continue meds as directed  Morbid obesity (HCC)  BMI 40.0-44.9, adult (HCC)        Obesity Treatment / Action Plan:  Patient will work on garnering support from family and friends to begin weight loss journey. Will work on eliminating or reducing the presence of highly palatable, calorie dense foods in the home. Will complete provided nutritional and psychosocial assessment questionnaire before the next appointment. Will be scheduled for indirect calorimetry to determine resting energy expenditure in a fasting state.  This will allow [place] to create a reduced calorie, high-protein meal plan to promote loss of fat mass while preserving muscle mass. Counseled on the health benefits of losing 5%-15% of total body weight. Was counseled on nutritional approaches to weight loss and benefits of reducing processed foods and consuming plant-based foods and high quality protein as part of nutritional weight management. Was counseled on pharmacotherapy and role as an adjunct in weight management.   Obesity Education Performed Today:  She was weighed on the bioimpedance scale and results were discussed and documented in the synopsis.  We discussed obesity as a disease and the importance of a more detailed evaluation of all the factors contributing to the disease.  We discussed the importance of long term lifestyle changes which include nutrition, exercise and behavioral modifications as well as the importance of customizing this to her specific health and social needs.  We discussed the benefits of reaching a healthier weight to alleviate the symptoms of existing conditions and reduce the risks of the biomechanical, metabolic and  psychological effects of obesity.  [name] appears to be in the action stage of change and states they are ready to start intensive lifestyle modifications and behavioral modifications.  30 minutes was spent today on this visit including the above counseling, pre-visit chart review, and post-visit documentation.  Reviewed by clinician on day of visit: allergies, medications, problem list, medical history, surgical history, family history, social history, and previous encounter notes pertinent to obesity diagnosis.    Theodis Sato Emilea Goga FNP-C

## 2023-01-29 ENCOUNTER — Ambulatory Visit (INDEPENDENT_AMBULATORY_CARE_PROVIDER_SITE_OTHER): Payer: Medicare Other | Admitting: Family Medicine

## 2023-01-29 ENCOUNTER — Encounter: Payer: Self-pay | Admitting: Family Medicine

## 2023-01-29 VITALS — BP 141/91 | HR 61 | Temp 98.1°F | Ht 64.0 in | Wt 241.0 lb

## 2023-01-29 DIAGNOSIS — Z1331 Encounter for screening for depression: Secondary | ICD-10-CM | POA: Insufficient documentation

## 2023-01-29 DIAGNOSIS — I10 Essential (primary) hypertension: Secondary | ICD-10-CM | POA: Diagnosis not present

## 2023-01-29 DIAGNOSIS — R0602 Shortness of breath: Secondary | ICD-10-CM

## 2023-01-29 DIAGNOSIS — R7303 Prediabetes: Secondary | ICD-10-CM | POA: Diagnosis not present

## 2023-01-29 DIAGNOSIS — Z0289 Encounter for other administrative examinations: Secondary | ICD-10-CM

## 2023-01-29 DIAGNOSIS — E038 Other specified hypothyroidism: Secondary | ICD-10-CM

## 2023-01-29 DIAGNOSIS — R5383 Other fatigue: Secondary | ICD-10-CM | POA: Insufficient documentation

## 2023-01-29 DIAGNOSIS — J45909 Unspecified asthma, uncomplicated: Secondary | ICD-10-CM | POA: Insufficient documentation

## 2023-01-29 DIAGNOSIS — Z6841 Body Mass Index (BMI) 40.0 and over, adult: Secondary | ICD-10-CM

## 2023-01-30 LAB — VITAMIN B12: Vitamin B-12: 381 pg/mL (ref 232–1245)

## 2023-01-30 LAB — LIPID PANEL
Chol/HDL Ratio: 4.3 ratio (ref 0.0–4.4)
Cholesterol, Total: 182 mg/dL (ref 100–199)
HDL: 42 mg/dL (ref >39)
LDL Chol Calc (NIH): 112 mg/dL — ABNORMAL HIGH (ref 0–99)
Triglycerides: 160 mg/dL — ABNORMAL HIGH (ref 0–149)
VLDL Cholesterol Cal: 28 mg/dL (ref 5–40)

## 2023-01-30 LAB — VITAMIN D 25 HYDROXY (VIT D DEFICIENCY, FRACTURES): Vit D, 25-Hydroxy: 31 ng/mL (ref 30.0–100.0)

## 2023-01-30 LAB — FOLATE: Folate: 15.7 ng/mL (ref >3.0)

## 2023-01-30 LAB — INSULIN, RANDOM: INSULIN: 27.4 u[IU]/mL — ABNORMAL HIGH (ref 2.6–24.9)

## 2023-02-03 NOTE — Progress Notes (Signed)
Chief Complaint:   OBESITY Tamara Rosales (MR# [id number]) is a 67 y.o. female who presents for evaluation and treatment of obesity and related comorbidities. Current BMI is Body mass index is 41.37 kg/m. Tamara Rosales has been struggling with her weight for many years and has been unsuccessful in either losing weight, maintaining weight loss, or reaching her healthy weight goal.  Tamara Rosales is currently in the action stage of change and ready to dedicate time achieving and maintaining a healthier weight. Tamara Rosales is interested in becoming our patient and working on intensive lifestyle modifications including (but not limited to) diet and exercise for weight loss.  Patient would like to lose 60 pounds.  She likes to cook.  She is married and retired.  She drinks juice.  She is a stress and boredom eater.  She walks 30 to 45 minutes 3 times per week.  She has gained weight since menopause.  She has been less active in the past year with left foot and ankle pain.  Tamara Rosales's habits were reviewed today and are as follows: Her family eats meals together, she thinks her family will eat healthier with her, her desired weight loss is 61 lbs, she has been heavy most of her life, she started gaining weight 20 years ago, her heaviest weight ever was 249 pounds, she has significant food cravings issues, she skips meals frequently, she is frequently drinking liquids with calories, she frequently eats larger portions than normal, and she struggles with emotional eating.  Depression Screen Tamara Rosales Food and Mood (modified PHQ-9) score was 4.  Subjective:   1. Other fatigue Tamara Rosales admits to daytime somnolence and denies waking up still tired. Patient has a history of symptoms of daytime fatigue. Tamara Rosales generally gets 5 or 7 hours of sleep per night, and states that she has generally restful sleep. Snoring is present. Apneic episodes are not present. Epworth Sleepiness Score is 3.  EKG was reviewed from  April 2024.  2. SOBOE (shortness of breath on exertion) Tamara Rosales notes increasing shortness of breath with exercising and seems to be worsening over time with weight gain. She notes getting out of breath sooner with activity than she used to. This has not gotten worse recently. Tamara Rosales denies shortness of breath at rest or orthopnea.  3. Asthma, unspecified asthma severity, unspecified whether complicated, unspecified whether persistent Patient is on albuterol HFA as needed.  Her asthma is triggered by allergies.  She is on Allegra once daily.  4. Essential hypertension Patient's blood pressure is elevated.  She is taking lisinopril-hydrochlorothiazide 2 tablets 20-12.5 mg once daily and nebivolol 7.5 mg once daily.  5. Other specified hypothyroidism Patient's recent TSH was 3.8 on 01/07/2023.  She takes levothyroxine 150 mcg once daily.  She denies constipation.  6. Pre-diabetes Patient's recent A1c was 5.8 on 01/07/2023.  She has a family history of type 2 diabetes mellitus.  She denies gestational diabetes mellitus.  She has never been on metformin.  Assessment/Plan:   1. Other fatigue Tamara Rosales does feel that her weight is causing her energy to be lower than it should be. Fatigue may be related to obesity, depression or many other causes. Labs will be ordered, and in the meanwhile, Samijo will focus on self care including making healthy food choices, increasing physical activity and focusing on stress reduction.  - VITAMIN D 25 Hydroxy (Vit-D Deficiency, Fractures) - Lipid panel - Insulin, random - Folate - Vitamin B12  2. SOBOE (shortness of breath on exertion) Tamara Rosales  does feel that she gets out of breath more easily that she used to when she exercises. Tamara Rosales shortness of breath appears to be obesity related and exercise induced. She has agreed to work on weight loss and gradually increase exercise to treat her exercise induced shortness of breath. Will continue to monitor  closely.  3. Asthma, unspecified asthma severity, unspecified whether complicated, unspecified whether persistent Patient will continue to use albuterol HFA if needed with exercise.  4. Essential hypertension Look for blood pressure improvements with weight loss.  5. Other specified hypothyroidism Patient will continue levothyroxine 150 mcg once daily.  6. Pre-diabetes We will check labs today, and we will follow-up at her next visit.  Patient will begin her prescribed diet.  - Insulin, random  7. Depression screen Tamara Rosales had a negative depression screening.   8. BMI 40.0-44.9, adult (HCC)  9. Morbid obesity with starting BMI 41.5 Tamara Rosales is currently in the action stage of change and her goal is to continue with weight loss efforts. I recommend Tamara Rosales begin the structured treatment plan as follows:  She has agreed to the Category 2 Plan.  100-calorie snack list was given.  Exercise goals: All adults should avoid inactivity. Some physical activity is better than none, and adults who participate in any amount of physical activity gain some health benefits.   Behavioral modification strategies: increasing lean protein intake, increasing water intake, increasing high fiber foods, decreasing eating out, no skipping meals, meal planning and cooking strategies, keeping healthy foods in the home, better snacking choices, avoiding temptations, and decreasing junk food.  She was informed of the importance of frequent follow-up visits to maximize her success with intensive lifestyle modifications for her multiple health conditions. She was informed we would discuss her lab results at her next visit unless there is a critical issue that needs to be addressed sooner. Tamara Rosales agreed to keep her next visit at the agreed upon time to discuss these results.  Objective:   Blood pressure (!) 141/91, pulse 61, temperature 98.1 F (36.7 C), height 5\' 4"  (1.626 m), weight 241 lb (109.3 kg), SpO2  96%. Body mass index is 41.37 kg/m.  EKG: Normal sinus rhythm, rate 81 BPM.  Indirect Calorimeter completed today shows a VO2 of 240 and a REE of 1656.  Her calculated basal metabolic rate is 1610 thus her basal metabolic rate is worse than expected.  General: Cooperative, alert, well developed, in no acute distress. HEENT: Conjunctivae and lids unremarkable. Cardiovascular: Regular rhythm.  Lungs: Normal work of breathing. Neurologic: No focal deficits.   Lab Results  Component Value Date   CREATININE 0.80 09/28/2022   BUN 19 09/28/2022   NA 141 09/28/2022   K 3.3 (L) 09/28/2022   CL 102 09/28/2022   CO2 27 09/28/2022   Lab Results  Component Value Date   ALT 24 09/28/2022   AST 19 09/28/2022   ALKPHOS 39 09/28/2022   BILITOT 0.3 09/28/2022   No results found for: "HGBA1C" Lab Results  Component Value Date   INSULIN 27.4 (H) 01/29/2023   Lab Results  Component Value Date   TSH 1.690 03/26/2020   Lab Results  Component Value Date   CHOL 182 01/29/2023   HDL 42 01/29/2023   LDLCALC 112 (H) 01/29/2023   TRIG 160 (H) 01/29/2023   CHOLHDL 4.3 01/29/2023   Lab Results  Component Value Date   WBC 6.7 09/28/2022   HGB 15.2 (H) 09/28/2022   HCT 43.1 09/28/2022   MCV 96.0 09/28/2022  PLT 238 09/28/2022   No results found for: "IRON", "TIBC", "FERRITIN"  Attestation Statements:   Reviewed by clinician on day of visit: allergies, medications, problem list, medical history, surgical history, family history, social history, and previous encounter notes.  I have personally spent 40 minutes total time today in preparation, patient care, nutritional counseling and documentation for this visit, including the following: review of clinical lab tests; review of medical tests/procedures/services.   Trude Mcburney, am acting as transcriptionist for Seymour Bars, DO.  I have reviewed the above documentation for accuracy and completeness, and I agree with the above. Seymour Bars, DO

## 2023-02-12 ENCOUNTER — Ambulatory Visit: Payer: Medicare Other | Admitting: Family Medicine

## 2023-02-12 ENCOUNTER — Encounter: Payer: Self-pay | Admitting: Family Medicine

## 2023-02-12 VITALS — BP 118/83 | HR 77 | Temp 98.6°F | Ht 64.0 in | Wt 239.0 lb

## 2023-02-12 DIAGNOSIS — E559 Vitamin D deficiency, unspecified: Secondary | ICD-10-CM | POA: Diagnosis not present

## 2023-02-12 DIAGNOSIS — R7303 Prediabetes: Secondary | ICD-10-CM

## 2023-02-12 DIAGNOSIS — Z6841 Body Mass Index (BMI) 40.0 and over, adult: Secondary | ICD-10-CM

## 2023-02-12 DIAGNOSIS — E785 Hyperlipidemia, unspecified: Secondary | ICD-10-CM | POA: Diagnosis not present

## 2023-02-12 DIAGNOSIS — E88819 Insulin resistance, unspecified: Secondary | ICD-10-CM | POA: Insufficient documentation

## 2023-02-12 MED ORDER — VITAMIN D (ERGOCALCIFEROL) 1.25 MG (50000 UNIT) PO CAPS
50000.0000 [IU] | ORAL_CAPSULE | ORAL | 0 refills | Status: DC
Start: 2023-02-12 — End: 2023-03-16

## 2023-02-12 NOTE — Assessment & Plan Note (Signed)
Reviewed lab results with patient.  Her fasting insulin was elevated at 27.4.  She has had symptoms of hyperinsulinemia including weight gain, carb and sugar cravings.  She has started initiating dietary changes but has not been able to fully implement changes due to construction of her kitchen, increasing meals out and has had to limit her walking time due to left knee pain, seeing Ortho.  Look for improvements and fasting insulin levels in the next 3 to 6 months with continuing lifestyle changes focused on a reduction of added sugar and refined carbohydrates, increasing walking time and weight reduction.  Consider use of metformin if needed.

## 2023-02-12 NOTE — Assessment & Plan Note (Signed)
Her last A1c was 5.8 on 01/07/2023.  She has never taken any metformin.  She has started working on her prescribed diet, reducing added sugar and refined carbohydrates.  She has started initiating weight reduction.  Consider use of metformin in the future for prediabetes and insulin resistance.  Continue working on prescribed dietary changes

## 2023-02-12 NOTE — Assessment & Plan Note (Signed)
Last vitamin D Lab Results  Component Value Date   VD25OH 31.0 01/29/2023   Reviewed lab results with patient.  She is currently taking a calcium and vitamin D over-the-counter supplement including 1000 IU of vitamin D daily.  We discussed the risk of vitamin D deficiency including poor immune function, loss of bone mass and fatigue.  Ideal vitamin D level 50-70 to help with leptin resistance.  Begin vitamin D prescription 50,000 IU once weekly.  Recheck level in 3 to 4 months.

## 2023-02-12 NOTE — Progress Notes (Signed)
Office: [phone]  /  Fax: [phone]  WEIGHT SUMMARY AND BIOMETRICS  Starting Date: 01/29/23  Starting Weight: 241lb   Weight Lost Since Last Visit: 2lb   Vitals Temp: 98.6 F (37 C) BP: 118/83 Pulse Rate: 77 SpO2: 97 %   Body Composition  Body Fat %: 48.4 % Fat Mass (lbs): 115.8 lbs Muscle Mass (lbs): 117.2 lbs Total Body Water (lbs): 91 lbs Visceral Fat Rating : 17   HPI  Chief Complaint: OBESITY  Tamara Rosales is here to discuss her progress with her obesity treatment plan. She is on the the Category 2 Plan and states she is following her eating plan approximately 75-80 % of the time. She states she is exercising 0 minutes 0 times per week.  Interval History:  Since last office visit she is down 2 lb She has had to eat out more as she is remodeling her kitchen She is doing better getting protein in She has been drinking a Insurance account manager Protein shake for breakfast for now She usually has a banana mid morning She is drinking more water She has been skipping lunch, having an apple or nuts in the mid afternoon and then dinner at night Walking has been limited due to L knee pain She is looking into wall pilates  Pharmacotherapy: none  PHYSICAL EXAM:  Blood pressure 118/83, pulse 77, temperature 98.6 F (37 C), height 5\' 4"  (1.626 m), weight 239 lb (108.4 kg), SpO2 97%. Body mass index is 41.02 kg/m.  General: She is overweight, cooperative, alert, well developed, and in no acute distress. PSYCH: Has normal mood, affect and thought process.   Lungs: Normal breathing effort, no conversational dyspnea.   ASSESSMENT AND PLAN  TREATMENT PLAN FOR OBESITY:  Recommended Dietary Goals  Tamara Rosales is currently in the action stage of change. As such, her goal is to continue weight management plan. She has agreed to the Category 2 Plan.  Behavioral Intervention  We discussed the following Behavioral Modification Strategies today: increasing lean protein intake,  decreasing simple carbohydrates , increasing vegetables, increasing lower glycemic fruits, avoiding skipping meals, increasing water intake, work on meal planning and preparation, keeping healthy foods at home, work on managing stress, creating time for self-care and relaxation measures, continue to practice mindfulness when eating, and planning for success.  Additional resources provided today: NA  Recommended Physical Activity Goals  Tamara Rosales has been advised to work up to 150 minutes of moderate intensity aerobic activity a week and strengthening exercises 2-3 times per week for cardiovascular health, weight loss maintenance and preservation of muscle mass.   She has agreed to [employer] strengthening exercises with a goal of 2-3 sessions a week   Pharmacotherapy changes for the treatment of obesity: none  ASSOCIATED CONDITIONS ADDRESSED TODAY  Vitamin D deficiency Assessment & Plan: Last vitamin D Lab Results  Component Value Date   VD25OH 31.0 01/29/2023   Reviewed lab results with patient.  She is currently taking a calcium and vitamin D over-the-counter supplement including 1000 IU of vitamin D daily.  We discussed the risk of vitamin D deficiency including poor immune function, loss of bone mass and fatigue.  Ideal vitamin D level 50-70 to help with leptin resistance.  Begin vitamin D prescription 50,000 IU once weekly.  Recheck level in 3 to 4 months.  Orders: -     Vitamin D (Ergocalciferol); Take 1 capsule (50,000 Units total) by mouth every 7 (seven) days.  Dispense: 5 capsule; Refill: 0  Hyperlipidemia, unspecified hyperlipidemia type Assessment &  Plan: The 10-year ASCVD risk score (Arnett DK, et al., 2019) is: 8.2%   Values used to calculate the score:     Age: 67 years     Sex: Female     Is Non-Hispanic African American: No     Diabetic: No     Tobacco smoker: No     Systolic Blood Pressure: 118 mmHg     Is BP treated: Yes     HDL Cholesterol: 42 mg/dL     Total  Cholesterol: 182 mg/dL   Lab Results  Component Value Date   CHOL 182 01/29/2023   HDL 42 01/29/2023   LDLCALC 112 (H) 01/29/2023   TRIG 160 (H) 01/29/2023   CHOLHDL 4.3 01/29/2023   Reviewed lab results with patient.  Her HDL is low at 42 and should be over 49.  She plans to increase her walking time once her knee pain improves.  Her LDL target is less than 100 with a slightly elevated ASCVD risk score.  She has never taking any cholesterol-lowering medications.  Her triglycerides are slightly elevated.  She has started implementing a low saturated fat/low sugar diet.  Look for improvements with fasting lipid panel in the next 3 to 6 months.  Continue prescribed dietary plan, adding in regular exercise with a target of 30 minutes at least 3 days a week.    Morbid obesity (HCC) with starting BMI 41  BMI 40.0-44.9, adult (HCC)  Pre-diabetes Assessment & Plan: Her last A1c was 5.8 on 01/07/2023.  She has never taken any metformin.  She has started working on her prescribed diet, reducing added sugar and refined carbohydrates.  She has started initiating weight reduction.  Consider use of metformin in the future for prediabetes and insulin resistance.  Continue working on prescribed dietary changes   Insulin resistance Assessment & Plan: Reviewed lab results with patient.  Her fasting insulin was elevated at 27.4.  She has had symptoms of hyperinsulinemia including weight gain, carb and sugar cravings.  She has started initiating dietary changes but has not been able to fully implement changes due to construction of her kitchen, increasing meals out and has had to limit her walking time due to left knee pain, seeing Ortho.  Look for improvements and fasting insulin levels in the next 3 to 6 months with continuing lifestyle changes focused on a reduction of added sugar and refined carbohydrates, increasing walking time and weight reduction.  Consider use of metformin if needed.        She was informed of the importance of frequent follow up visits to maximize her success with intensive lifestyle modifications for her multiple health conditions.   ATTESTASTION STATEMENTS:  Reviewed by clinician on day of visit: allergies, medications, problem list, medical history, surgical history, family history, social history, and previous encounter notes pertinent to obesity diagnosis.   I have personally spent 30 minutes total time today in preparation, patient care, nutritional counseling and documentation for this visit, including the following: review of clinical lab tests; review of medical tests/procedures/services.      [name], DO DABFM, DABOM [facility] [address] [place], [place] [address] [phone]

## 2023-02-12 NOTE — Assessment & Plan Note (Addendum)
The 10-year ASCVD risk score (Arnett DK, et al., 2019) is: 8.2%   Values used to calculate the score:     Age: 67 years     Sex: Female     Is Non-Hispanic African American: No     Diabetic: No     Tobacco smoker: No     Systolic Blood Pressure: 118 mmHg     Is BP treated: Yes     HDL Cholesterol: 42 mg/dL     Total Cholesterol: 182 mg/dL   Lab Results  Component Value Date   CHOL 182 01/29/2023   HDL 42 01/29/2023   LDLCALC 112 (H) 01/29/2023   TRIG 160 (H) 01/29/2023   CHOLHDL 4.3 01/29/2023   Reviewed lab results with patient.  Her HDL is low at 42 and should be over 49.  She plans to increase her walking time once her knee pain improves.  Her LDL target is less than 100 with a slightly elevated ASCVD risk score.  She has never taking any cholesterol-lowering medications.  Her triglycerides are slightly elevated.  She has started implementing a low saturated fat/low sugar diet.  Look for improvements with fasting lipid panel in the next 3 to 6 months.  Continue prescribed dietary plan, adding in regular exercise with a target of 30 minutes at least 3 days a week.

## 2023-03-05 ENCOUNTER — Ambulatory Visit: Payer: Medicare Other | Admitting: Family Medicine

## 2023-03-16 ENCOUNTER — Encounter: Payer: Self-pay | Admitting: Family Medicine

## 2023-03-16 ENCOUNTER — Ambulatory Visit: Payer: Medicare Other | Admitting: Family Medicine

## 2023-03-16 VITALS — BP 126/79 | HR 68 | Temp 98.2°F | Ht 64.0 in | Wt 234.0 lb

## 2023-03-16 DIAGNOSIS — R7303 Prediabetes: Secondary | ICD-10-CM

## 2023-03-16 DIAGNOSIS — E88819 Insulin resistance, unspecified: Secondary | ICD-10-CM

## 2023-03-16 DIAGNOSIS — E559 Vitamin D deficiency, unspecified: Secondary | ICD-10-CM

## 2023-03-16 DIAGNOSIS — Z6841 Body Mass Index (BMI) 40.0 and over, adult: Secondary | ICD-10-CM

## 2023-03-16 DIAGNOSIS — E66813 Obesity, class 3: Secondary | ICD-10-CM

## 2023-03-16 DIAGNOSIS — E662 Morbid (severe) obesity with alveolar hypoventilation: Secondary | ICD-10-CM

## 2023-03-16 MED ORDER — VITAMIN D (ERGOCALCIFEROL) 1.25 MG (50000 UNIT) PO CAPS
50000.0000 [IU] | ORAL_CAPSULE | ORAL | 0 refills | Status: DC
Start: 2023-03-16 — End: 2023-04-15

## 2023-03-16 NOTE — Assessment & Plan Note (Signed)
She has been in the prediabetic range with an A1c of 5.8 in August She is working on prescribed diet, increased walking and weight reduction  Consider adding metformin Continue to work on lifestyle change goals as reviewed under IR diagnosis Repeat A1c in 2 mos

## 2023-03-16 NOTE — Assessment & Plan Note (Signed)
Cravings and excess hunger have improved with prescribed diet with lean protein and fiber with meals and eating on a schedule.  Last fasting insulin high at 27.4.  reduced high sugar foods and drinks  Continue to work on dietary change goals Increase walking time to 150 min/ wk Consider adding metformin

## 2023-03-16 NOTE — Progress Notes (Signed)
Office: [phone]  /  Fax: [phone]  WEIGHT SUMMARY AND BIOMETRICS  Starting Date: 01/29/23  Starting Weight: 241lb   Weight Lost Since Last Visit: 5lb   Vitals Temp: 98.2 F (36.8 C) BP: 126/79 Pulse Rate: 68 SpO2: 98 %   Body Composition  Body Fat %: 48.2 % Fat Mass (lbs): 113 lbs Muscle Mass (lbs): 115.4 lbs Total Body Water (lbs): 90 lbs Visceral Fat Rating : 16    HPI  Chief Complaint: OBESITY  Tamara Rosales is here to discuss her progress with her obesity treatment plan. She is on the the Category 2 Plan and states she is following her eating plan approximately 75 % of the time. She states she is walking  for 30 minutes 4 times per week.   Interval History:  Since last office visit she is down 5 lb This gives her a net weight loss of 7 lb in the past 6 weeks of medically supervised weight management She has had to eat out more do to remodeling her kitchen She is walking more She has not been getting in everything on her meal plan Denies cravings or excess hunger She down 1.8 lb of muscle and is down 2.8 lb of body fat in the past month She has bought free weights for home She is thinking about getting to the gym doing [employer]  Pharmacotherapy: none  PHYSICAL EXAM:  Blood pressure 126/79, pulse 68, temperature 98.2 F (36.8 C), height 5\' 4"  (1.626 m), weight 234 lb (106.1 kg), SpO2 98%. Body mass index is 40.17 kg/m.  General: She is overweight, cooperative, alert, well developed, and in no acute distress. PSYCH: Has normal mood, affect and thought process.   Lungs: Normal breathing effort, no conversational dyspnea.   ASSESSMENT AND PLAN  TREATMENT PLAN FOR OBESITY:  Recommended Dietary Goals  Danialle is currently in the action stage of change. As such, her goal is to continue weight management plan. She has agreed to the Category 2 Plan. - prioritize protein with snacks to slow down muscle loss  Behavioral Intervention  We  discussed the following Behavioral Modification Strategies today: increasing lean protein intake to established goals, increasing vegetables, avoiding skipping meals, increasing water intake , work on meal planning and preparation, keeping healthy foods at home, planning for success, better snacking choices, and continue to work on maintaining a reduced calorie state, getting the recommended amount of protein, incorporating whole foods, making healthy choices, staying well hydrated and practicing mindfulness when eating..  Additional resources provided today: NA  Recommended Physical Activity Goals  [name] has been advised to work up to 150 minutes of moderate intensity aerobic activity a week and strengthening exercises 2-3 times per week for cardiovascular health, weight loss maintenance and preservation of muscle mass.   She has agreed to [employer] strengthening exercises with a goal of 2-3 sessions a week  and Start aerobic activity with a goal of 150 minutes a week at moderate intensity.   Pharmacotherapy changes for the treatment of obesity: none  ASSOCIATED CONDITIONS ADDRESSED TODAY  Pre-diabetes Assessment & Plan: She has been in the prediabetic range with an A1c of 5.8 in August She is working on prescribed diet, increased walking and weight reduction  Consider adding metformin Continue to work on lifestyle change goals as reviewed under IR diagnosis Repeat A1c in 2 mos   Vitamin D deficiency Assessment & Plan: Last vitamin D Lab Results  Component Value Date   VD25OH 31.0 01/29/2023   She is  doing well on RX vitamin D weekly Energy level is improving  Repeat lab in 2 mos  Orders: -     Vitamin D (Ergocalciferol); Take 1 capsule (50,000 Units total) by mouth every 7 (seven) days.  Dispense: 5 capsule; Refill: 0  Class 3 obesity with alveolar hypoventilation, serious comorbidity, and body mass index (BMI) of 40.0 to 44.9 in adult [facility])  Insulin resistance Assessment &  Plan: Cravings and excess hunger have improved with prescribed diet with lean protein and fiber with meals and eating on a schedule.  Last fasting insulin high at 27.4.  reduced high sugar foods and drinks  Continue to work on dietary change goals Increase walking time to 150 min/ wk Consider adding metformin       She was informed of the importance of frequent follow up visits to maximize her success with intensive lifestyle modifications for her multiple health conditions.   ATTESTASTION STATEMENTS:  Reviewed by clinician on day of visit: allergies, medications, problem list, medical history, surgical history, family history, social history, and previous encounter notes pertinent to obesity diagnosis.   I have personally spent 30 minutes total time today in preparation, patient care, nutritional counseling and documentation for this visit, including the following: review of clinical lab tests; review of medical tests/procedures/services.      [name], DO DABFM, DABOM [facility] [address] [place], [place] [address] [phone]

## 2023-03-16 NOTE — Assessment & Plan Note (Signed)
Last vitamin D Lab Results  Component Value Date   VD25OH 31.0 01/29/2023   She is doing well on RX vitamin D weekly Energy level is improving  Repeat lab in 2 mos

## 2023-04-09 ENCOUNTER — Ambulatory Visit: Payer: Medicare Other | Admitting: Family Medicine

## 2023-04-15 ENCOUNTER — Ambulatory Visit: Payer: Medicare Other | Admitting: Family Medicine

## 2023-04-15 ENCOUNTER — Encounter: Payer: Self-pay | Admitting: Family Medicine

## 2023-04-15 VITALS — BP 150/94 | HR 57 | Temp 98.1°F | Ht 64.0 in | Wt 238.0 lb

## 2023-04-15 DIAGNOSIS — I10 Essential (primary) hypertension: Secondary | ICD-10-CM | POA: Diagnosis not present

## 2023-04-15 DIAGNOSIS — E88819 Insulin resistance, unspecified: Secondary | ICD-10-CM | POA: Diagnosis not present

## 2023-04-15 DIAGNOSIS — E559 Vitamin D deficiency, unspecified: Secondary | ICD-10-CM | POA: Diagnosis not present

## 2023-04-15 DIAGNOSIS — E8881 Metabolic syndrome: Secondary | ICD-10-CM

## 2023-04-15 DIAGNOSIS — Z6841 Body Mass Index (BMI) 40.0 and over, adult: Secondary | ICD-10-CM

## 2023-04-15 DIAGNOSIS — E66813 Obesity, class 3: Secondary | ICD-10-CM

## 2023-04-15 MED ORDER — VITAMIN D (ERGOCALCIFEROL) 1.25 MG (50000 UNIT) PO CAPS
50000.0000 [IU] | ORAL_CAPSULE | ORAL | 0 refills | Status: DC
Start: 2023-04-15 — End: 2023-08-28

## 2023-04-15 MED ORDER — METFORMIN HCL ER 500 MG PO TB24
500.0000 mg | ORAL_TABLET | Freq: Every day | ORAL | 0 refills | Status: DC
Start: 2023-04-15 — End: 2023-08-28

## 2023-04-15 NOTE — Assessment & Plan Note (Signed)
Last fasting insulin high at 27.4.  she is working on prescribed diet, low in added sugar and excess starches but has struggled to stay on track with kitchen remodel and has not yet added in regular exercise to aid in insulin sensitivity.  Begin metformin XR 500 mg once daily for IR along with prescribed diet and a plan for regular exercise

## 2023-04-15 NOTE — Assessment & Plan Note (Signed)
BP is elevated today on Lisinopril/ hydrochlorothiazide 20/12.5 mg daily and Bystolic 7.5 mg daily.  She denies HA or CP.  She reports good compliance taking her medications.  Continue listed antihypertensive meds Continue active plan for weight reduction Monitor resting Bps at home 1-2 x a week

## 2023-04-15 NOTE — Assessment & Plan Note (Signed)
Last vitamin D Lab Results  Component Value Date   VD25OH 31.0 01/29/2023   She is doing well on RX vitamin D weekly Energy level is starting to improve  Repeat lab in Jan

## 2023-04-15 NOTE — Assessment & Plan Note (Signed)
She meets the criteria for metabolic syndrome with TG > 150, HDL <49, abdominal circumference > 35"  She is working on reducing added sugar, starches and has a plan to start gym workouts Begin metformin XR 500 mg once daily with food along with dietary changes

## 2023-04-15 NOTE — Progress Notes (Signed)
Office: [phone]  /  Fax: [phone]  WEIGHT SUMMARY AND BIOMETRICS  Starting Date: 01/29/23  Starting Weight: 241lb   Weight Lost Since Last Visit: 0lb   Vitals Temp: 98.1 F (36.7 C) BP: (!) 150/94 Pulse Rate: (!) 57 SpO2: 96 %   Body Composition  Body Fat %: 49 % Fat Mass (lbs): 116.8 lbs Muscle Mass (lbs): 115.4 lbs Total Body Water (lbs): 93.2 lbs Visceral Fat Rating : 17   HPI  Chief Complaint: OBESITY  Tamara Rosales is here to discuss her progress with her obesity treatment plan. She is on the the Category 2 Plan and states she is following her eating plan approximately 50 % of the time. She states she is exercising 15 minutes 7 times per week.  Interval History:  Since last office visit she is up 4 lb This gives her a net weight loss of 3 lb in 2 mos She is still going thru a kitchen remodel, forced to eat out more She has worked [employer] of the election and her daughter has been struggling with T1DM She has not yet started silver sneakers at the gym She has been eating more on the go Denies sugar cravings or stress eating She maintained her muscle mass focused on protein in  Pharmacotherapy: none  PHYSICAL EXAM:  Blood pressure (!) 150/94, pulse (!) 57, temperature 98.1 F (36.7 C), height 5\' 4"  (1.626 m), weight 238 lb (108 kg), SpO2 96%. Body mass index is 40.85 kg/m.  General: She is overweight, cooperative, alert, well developed, and in no acute distress. PSYCH: Has normal mood, affect and thought process.   Lungs: Normal breathing effort, no conversational dyspnea.   ASSESSMENT AND PLAN  TREATMENT PLAN FOR OBESITY:  Recommended Dietary Goals  [name] is currently in the action stage of change. As such, her goal is to continue weight management plan. She has agreed to the Category 2 Plan.  Behavioral Intervention  We discussed the following Behavioral Modification Strategies today: increasing lean protein intake to established  goals, increasing vegetables, increasing lower glycemic fruits, increasing fiber rich foods, increasing water intake , work on meal planning and preparation, keeping healthy foods at home, decreasing eating out or consumption of processed foods, and making healthy choices when eating convenient foods, planning for success, and continue to work on maintaining a reduced calorie state, getting the recommended amount of protein, incorporating whole foods, making healthy choices, staying well hydrated and practicing mindfulness when eating..  Additional resources provided today: NA  Recommended Physical Activity Goals  Tamara Rosales has been advised to work up to 150 minutes of moderate intensity aerobic activity a week and strengthening exercises 2-3 times per week for cardiovascular health, weight loss maintenance and preservation of muscle mass.   She has agreed to Think about enjoyable ways to increase daily physical activity and overcoming barriers to exercise and Increase physical activity in their day and reduce sedentary time (increase NEAT).  Pharmacotherapy changes for the treatment of obesity: none  ASSOCIATED CONDITIONS ADDRESSED TODAY  Insulin resistance Assessment & Plan: Last fasting insulin high at 27.4.  she is working on prescribed diet, low in added sugar and excess starches but has struggled to stay on track with kitchen remodel and has not yet added in regular exercise to aid in insulin sensitivity.  Begin metformin XR 500 mg once daily for IR along with prescribed diet and a plan for regular exercise  Orders: -     metFORMIN HCl ER; Take 1 tablet (500  mg total) by mouth daily with breakfast.  Dispense: 30 tablet; Refill: 0  Vitamin D deficiency Assessment & Plan: Last vitamin D Lab Results  Component Value Date   VD25OH 31.0 01/29/2023   She is doing well on RX vitamin D weekly Energy level is starting to improve  Repeat lab in Jan  Orders: -     Vitamin D  (Ergocalciferol); Take 1 capsule (50,000 Units total) by mouth every 7 (seven) days.  Dispense: 5 capsule; Refill: 0  Class 3 severe obesity due to excess calories with serious comorbidity and body mass index (BMI) of 40.0 to 44.9 in adult [facility])  Metabolic syndrome Assessment & Plan: She meets the criteria for metabolic syndrome with TG > 150, HDL <49, abdominal circumference > 35"  She is working on reducing added sugar, starches and has a plan to start gym workouts Begin metformin XR 500 mg once daily with food along with dietary changes  Orders: -     metFORMIN HCl ER; Take 1 tablet (500 mg total) by mouth daily with breakfast.  Dispense: 30 tablet; Refill: 0  Essential hypertension Assessment & Plan: BP is elevated today on Lisinopril/ hydrochlorothiazide 20/12.5 mg daily and Bystolic 7.5 mg daily.  She denies HA or CP.  She reports good compliance taking her medications.  Continue listed antihypertensive meds Continue active plan for weight reduction Monitor resting Bps at home 1-2 x a week       She was informed of the importance of frequent follow up visits to maximize her success with intensive lifestyle modifications for her multiple health conditions.   ATTESTASTION STATEMENTS:  Reviewed by clinician on day of visit: allergies, medications, problem list, medical history, surgical history, family history, social history, and previous encounter notes pertinent to obesity diagnosis.   I have personally spent 30 minutes total time today in preparation, patient care, nutritional counseling and documentation for this visit, including the following: review of clinical lab tests; review of medical tests/procedures/services.      Tamara Brink, DO DABFM, DABOM [facility] [address] [place], [place] [address] [phone]

## 2023-05-04 DIAGNOSIS — M1712 Unilateral primary osteoarthritis, left knee: Secondary | ICD-10-CM | POA: Diagnosis not present

## 2023-05-14 ENCOUNTER — Ambulatory Visit: Payer: Medicare Other | Admitting: Family Medicine

## 2023-05-19 ENCOUNTER — Other Ambulatory Visit: Payer: Self-pay | Admitting: Family Medicine

## 2023-05-19 ENCOUNTER — Telehealth (INDEPENDENT_AMBULATORY_CARE_PROVIDER_SITE_OTHER): Payer: Self-pay | Admitting: Family Medicine

## 2023-05-19 DIAGNOSIS — E8881 Metabolic syndrome: Secondary | ICD-10-CM

## 2023-05-19 DIAGNOSIS — E88819 Insulin resistance, unspecified: Secondary | ICD-10-CM

## 2023-05-19 NOTE — Telephone Encounter (Signed)
Pharmacy is calling in needing clarification on Rx Metformin wanting to know why it was denied.  Pt is out of the medication.  [facility] would like to have a call [phone] back so that they can notify the pt.

## 2023-05-20 ENCOUNTER — Encounter (INDEPENDENT_AMBULATORY_CARE_PROVIDER_SITE_OTHER): Payer: Self-pay

## 2023-05-21 ENCOUNTER — Ambulatory Visit: Payer: Medicare Other | Admitting: Family Medicine

## 2023-06-01 DIAGNOSIS — N6452 Nipple discharge: Secondary | ICD-10-CM | POA: Diagnosis not present

## 2023-06-04 ENCOUNTER — Other Ambulatory Visit: Payer: Self-pay | Admitting: Physician Assistant

## 2023-06-04 DIAGNOSIS — N6452 Nipple discharge: Secondary | ICD-10-CM

## 2023-06-16 ENCOUNTER — Telehealth: Payer: Self-pay | Admitting: Cardiology

## 2023-06-16 NOTE — Telephone Encounter (Signed)
 Patient c/o Palpitations:  STAT if patient reporting lightheadedness, shortness of breath, or chest pain  How long have you had palpitations/irregular HR/ Afib? Are you having the symptoms now? 3-4 weeks ago (near Christmas)   Are you currently experiencing lightheadedness, SOB or CP? No   Do you have a history of afib (atrial fibrillation) or irregular heart rhythm? No   Have you checked your BP or HR? (document readings if available): no   Are you experiencing any other symptoms?  Pt states she has CP occasionally (nothing currently). She is scheduled to see Vannie, NP 06/28/22.

## 2023-06-16 NOTE — Telephone Encounter (Signed)
 Spoke with patient regarding palpitations and chest pain Patient has been having palpitations every few weeks for some time, usually lasts about a day 1 week ago was sitting in church and had right sided pain in teeth and chest, lasted about 2 minutes Denied any shortness of breath  Only time she has had pain  Advised patient to keep 1/27 appointment, call back if worsening symptoms and call 911 if chest pains return without resolving  Verbalized understanding

## 2023-06-28 NOTE — Progress Notes (Unsigned)
  Cardiology Office Note:  .   Date:  06/29/2023  ID:  Tamara Rosales, DOB 14-May-1956, MRN [id number] PCP: Tamara Rosales  [facility] Providers Cardiologist:  Jodelle Red, MD    History of Present Illness: .   Tamara Rosales is a 68 y.o. female with history of hypertension, HLD, PAC, palpitations. Family history notable for father with CAD and first MI 89 yo and CABG around age 54. Brother with PPM.   Established with Dr. Cristal Deer 11/28/22 due to palpitations onset 09/2022. Prior TSH, renal function, CBC unremarkable.  She wore monitor with predominantly NSR and no high risk arrhythmias. 9 triggered events were all NSR.   Presents today for palpitations and chest pain. This morning woke up with a tightness in her chest. No issues yesterday. She did have episode 3 weeks ago while sitting in church with pain in her jaw and then felt as if someone was "squeezing" her chest lasting a minute. These two episodes felt a littler differently. Overall her palpitations have been quiescent. She attributes this to taking an OTC potassium.   ROS: Please see the history of present illness.    All other systems reviewed and are negative.   Studies Reviewed: .        Cardiac Studies & Procedures        MONITORS  LONG TERM MONITOR (3-14 DAYS) 12/22/2022  Narrative Patch Wear Time:  13 days and 23 hours (2024-06-28T10:42:10-398 to 2024-07-12T10:35:03-399)  Patient had a min HR of 39 bpm, max HR of 128 bpm, and avg HR of 66 bpm. Predominant underlying rhythm was Sinus Rhythm. No VT, SVT, atrial fibrillation, high degree block, or pauses noted. Isolated atrial and ventricular ectopy was rare (<1%). There were 9 triggered events, which were sinus. No high risk arrhythmias detected.           Risk Assessment/Calculations:             Physical Exam:   VS:  BP 128/86   Pulse 82   Ht 5\' 4"  (1.626 m)   Wt 243 lb 11.2 oz (110.5 kg)   SpO2 95%   BMI 41.83 kg/m     Wt Readings from Last 3 Encounters:  06/29/23 243 lb 11.2 oz (110.5 kg)  04/15/23 238 lb (108 kg)  03/16/23 234 lb (106.1 kg)    GEN: Well nourished, well developed in no acute distress NECK: No JVD; No carotid bruits CARDIAC: RRR, no murmurs, rubs, gallops RESPIRATORY:  Clear to auscultation without rales, wheezing or rhonchi  ABDOMEN: Soft, non-tender, non-distended EXTREMITIES:  No edema; No deformity   ASSESSMENT AND PLAN: .    Palpitations - Quiescent with OTC Potassium. Monitor 12/2022 with NSR and no arrhythmias.   Chest pain in adult / Family history of cardiovascular disease - Two episodes of chest pain with mixed typical and atypical features (occurred at rest, but with neck symptoms). EKG today no acute ST/T wave changes. Due to family history of CAD, plan for cardiac CTA. She is aware of use of nitroglycerin during study. BMP today.   HTN - BP well controlled. Continue current antihypertensive regimen.         Dispo: follow up in 2 mos  Signed, Alver Sorrow, NP

## 2023-06-29 ENCOUNTER — Ambulatory Visit (HOSPITAL_BASED_OUTPATIENT_CLINIC_OR_DEPARTMENT_OTHER): Payer: Medicare Other | Admitting: Family

## 2023-06-29 ENCOUNTER — Encounter (HOSPITAL_BASED_OUTPATIENT_CLINIC_OR_DEPARTMENT_OTHER): Payer: Self-pay | Admitting: Family

## 2023-06-29 VITALS — BP 128/86 | HR 82 | Ht 64.0 in | Wt 243.7 lb

## 2023-06-29 DIAGNOSIS — I491 Atrial premature depolarization: Secondary | ICD-10-CM | POA: Diagnosis not present

## 2023-06-29 DIAGNOSIS — R072 Precordial pain: Secondary | ICD-10-CM

## 2023-06-29 DIAGNOSIS — I10 Essential (primary) hypertension: Secondary | ICD-10-CM | POA: Diagnosis not present

## 2023-06-29 MED ORDER — METOPROLOL TARTRATE 100 MG PO TABS
ORAL_TABLET | ORAL | 0 refills | Status: DC
Start: 2023-06-29 — End: 2023-08-28

## 2023-06-29 NOTE — Patient Instructions (Addendum)
Medication Instructions:  Continue your current medications.   *If you need a refill on your cardiac medications before your next appointment, please call your pharmacy*   Lab Work: Your physician recommends that you return for lab work today: BMP  If you have labs (blood work) drawn today and your tests are completely normal, you will receive your results only by: MyChart Message (if you have MyChart) OR A paper copy in the mail If you have any lab test that is abnormal or we need to change your treatment, we will call you to review the results.   Testing/Procedures:  Your provider has recommended a cardiac CTA.  Follow-Up: At [facility], you and your health needs are our priority.  As part of our continuing mission to provide you with exceptional heart care, we have created designated Provider Care Teams.  These Care Teams include your primary Cardiologist (physician) and Advanced Practice Providers (APPs -  Physician Assistants and Nurse Practitioners) who all work together to provide you with the care you need, when you need it.  We recommend signing up for the patient portal called "MyChart".  Sign up information is provided on this After Visit Summary.  MyChart is used to connect with patients for Virtual Visits (Telemedicine).  Patients are able to view lab/test results, encounter notes, upcoming appointments, etc.  Non-urgent messages can be sent to your provider as well.   To learn more about what you can do with MyChart, go to [email or web address].    Your next appointment:   2 month(s)  Provider:   Jodelle Red, MD or Gillian Shields, NP    Other Instructions    Your cardiac CT will be scheduled at one of the below locations:   [facility] [address] [place], [place] [address] [phone]  OR  [facility] [address] [place], [place] [address] [phone]  OR    [facility] [address] [place], [place] [address] [phone]  OR   [facility] [address] [place], [place] [address] [phone]  If scheduled at [facility], please arrive at the [facility] and Children's Entrance (Entrance C2) of [facility] 30 minutes prior to test start time. You can use the FREE valet parking offered at entrance C (encouraged to control the heart rate for the test)  Proceed to the [facility] Radiology Department (first floor) to check-in and test prep.  All radiology patients and guests should use entrance C2 at [facility], accessed from [facility], even though the hospital's physical address listed is [address]    If scheduled at [facility] or [facility], please arrive 15 mins early for check-in and test prep.  There is spacious parking and easy access to the radiology department from the [facility] entrance. Please enter here and check-in with the desk attendant.   Please follow these instructions carefully (unless otherwise directed):  An IV will be required for this test and Nitroglycerin will be given.  Hold all erectile dysfunction medications at least 3 days (72 hrs) prior to test. (Ie viagra, cialis, sildenafil, tadalafil, etc)   On the Night Before the Test: Be sure to Drink plenty of water. Do not consume any caffeinated/decaffeinated beverages or chocolate 12 hours prior to your test. Do not take any antihistamines 12 hours prior to your test.  On the Day  of the Test: Drink plenty of water until 1 hour prior to the test. Do not eat any food 1 hour prior to test. You may take your regular medications prior to the test.  Take metoprolol (Lopressor) two hours prior to test. If you take Furosemide/Hydrochlorothiazide/Spironolactone/Chlorthalidone, please HOLD on the morning of the  test. Patients who wear a continuous glucose monitor MUST remove the device prior to scanning. FEMALES- please wear underwire-free bra if available, avoid dresses & tight clothing      After the Test: Drink plenty of water. After receiving IV contrast, you may experience a mild flushed feeling. This is normal. On occasion, you may experience a mild rash up to 24 hours after the test. This is not dangerous. If this occurs, you can take Benadryl 25 mg and increase your fluid intake. If you experience trouble breathing, this can be serious. If it is severe call 911 IMMEDIATELY. If it is mild, please call our office.  We will call to schedule your test 2-4 weeks out understanding that some insurance companies will need an authorization prior to the service being performed.   For more information and frequently asked questions, please visit our website : [email or web address]  For non-scheduling related questions, please contact the cardiac imaging nurse navigator should you have any questions/concerns: Cardiac Imaging Nurse Navigators Direct Office Dial: [phone]   For scheduling needs, including cancellations and rescheduling, please call [place], [phone].

## 2023-06-30 ENCOUNTER — Encounter (HOSPITAL_BASED_OUTPATIENT_CLINIC_OR_DEPARTMENT_OTHER): Payer: Self-pay

## 2023-06-30 LAB — BASIC METABOLIC PANEL
BUN/Creatinine Ratio: 24 (ref 12–28)
BUN: 19 mg/dL (ref 8–27)
CO2: 27 mmol/L (ref 20–29)
Calcium: 10 mg/dL (ref 8.7–10.3)
Chloride: 100 mmol/L (ref 96–106)
Creatinine, Ser: 0.8 mg/dL (ref 0.57–1.00)
Glucose: 98 mg/dL (ref 70–99)
Potassium: 3.4 mmol/L — ABNORMAL LOW (ref 3.5–5.2)
Sodium: 144 mmol/L (ref 134–144)
eGFR: 81 mL/min/{1.73_m2} (ref >59)

## 2023-07-01 ENCOUNTER — Telehealth (HOSPITAL_BASED_OUTPATIENT_CLINIC_OR_DEPARTMENT_OTHER): Payer: Self-pay

## 2023-07-01 DIAGNOSIS — I10 Essential (primary) hypertension: Secondary | ICD-10-CM

## 2023-07-01 MED ORDER — POTASSIUM CHLORIDE CRYS ER 20 MEQ PO TBCR: 20.0000 meq | EXTENDED_RELEASE_TABLET | Freq: Every day | ORAL | 0 refills | Status: DC

## 2023-07-01 NOTE — Telephone Encounter (Signed)
Called and spoke to pt; dicussed results. She verbalized understanding. Lab slips mailed to pt. New rx sent to local pharmacy.

## 2023-07-01 NOTE — Telephone Encounter (Signed)
-----   Message from Alver Sorrow sent at 06/30/2023  5:08 PM EST ----- Normal kidney function.  Potassium mildly low.  Recommend potassium 20 mill equivalent daily for 3 days.  Repeat BMP in a week to reassess potassium level.  Proceed with cardiac CTA as discussed in clinic.

## 2023-07-07 ENCOUNTER — Ambulatory Visit
Admission: RE | Admit: 2023-07-07 | Discharge: 2023-07-07 | Disposition: A | Discharge status: Home / Self Care | Payer: Medicare Other | Source: Ambulatory Visit | Attending: Physician Assistant | Admitting: Physician Assistant

## 2023-07-07 ENCOUNTER — Ambulatory Visit: Payer: Medicare Other

## 2023-07-07 ENCOUNTER — Telehealth (HOSPITAL_COMMUNITY): Payer: Self-pay | Admitting: *Deleted

## 2023-07-07 ENCOUNTER — Other Ambulatory Visit: Payer: Self-pay | Admitting: Physician Assistant

## 2023-07-07 DIAGNOSIS — N6452 Nipple discharge: Secondary | ICD-10-CM

## 2023-07-07 DIAGNOSIS — R928 Other abnormal and inconclusive findings on diagnostic imaging of breast: Secondary | ICD-10-CM | POA: Diagnosis not present

## 2023-07-07 DIAGNOSIS — N6320 Unspecified lump in the left breast, unspecified quadrant: Secondary | ICD-10-CM

## 2023-07-07 NOTE — Telephone Encounter (Signed)
 Attempted to call patient regarding upcoming cardiac CT appointment. Left message on voicemail with name and callback number  Larey Brick RN Navigator Cardiac Imaging [facility] [phone] Office [phone] Cell

## 2023-07-08 ENCOUNTER — Ambulatory Visit (HOSPITAL_COMMUNITY)
Admission: RE | Admit: 2023-07-08 | Discharge: 2023-07-08 | Disposition: A | Discharge status: Home / Self Care | Payer: Medicare Other | Source: Ambulatory Visit | Attending: Family | Admitting: Family

## 2023-07-08 DIAGNOSIS — R072 Precordial pain: Secondary | ICD-10-CM | POA: Diagnosis not present

## 2023-07-08 MED ORDER — NITROGLYCERIN 0.4 MG SL SUBL
0.8000 mg | SUBLINGUAL_TABLET | Freq: Once | SUBLINGUAL | Status: AC
  Administered 2023-07-08: 0.8 mg via SUBLINGUAL

## 2023-07-08 MED ORDER — NITROGLYCERIN 0.4 MG SL SUBL
SUBLINGUAL_TABLET | SUBLINGUAL | Status: AC
  Filled 2023-07-08: qty 2

## 2023-07-08 MED ORDER — IOHEXOL 350 MG/ML SOLN
95.0000 mL | Freq: Once | INTRAVENOUS | Status: AC | PRN
  Administered 2023-07-08: 95 mL via INTRAVENOUS

## 2023-07-08 NOTE — Progress Notes (Signed)
 Patient tolerated CT well. Vital signs stable encourage to drink water throughout day.Reasons explained and verbalized understanding. Ambulated steady gait.

## 2023-07-10 ENCOUNTER — Encounter (HOSPITAL_BASED_OUTPATIENT_CLINIC_OR_DEPARTMENT_OTHER): Payer: Self-pay

## 2023-07-10 DIAGNOSIS — E785 Hyperlipidemia, unspecified: Secondary | ICD-10-CM

## 2023-07-10 DIAGNOSIS — I10 Essential (primary) hypertension: Secondary | ICD-10-CM | POA: Diagnosis not present

## 2023-07-10 MED ORDER — ROSUVASTATIN CALCIUM 5 MG PO TABS
10.0000 mg | ORAL_TABLET | Freq: Every day | ORAL | 1 refills | Status: DC
Start: 2023-07-10 — End: 2023-10-08

## 2023-07-11 LAB — BASIC METABOLIC PANEL
BUN/Creatinine Ratio: 18 (ref 12–28)
BUN: 16 mg/dL (ref 8–27)
CO2: 26 mmol/L (ref 20–29)
Calcium: 9.6 mg/dL (ref 8.7–10.3)
Chloride: 100 mmol/L (ref 96–106)
Creatinine, Ser: 0.88 mg/dL (ref 0.57–1.00)
Glucose: 93 mg/dL (ref 70–99)
Potassium: 3.5 mmol/L (ref 3.5–5.2)
Sodium: 144 mmol/L (ref 134–144)
eGFR: 72 mL/min/{1.73_m2} (ref >59)

## 2023-07-12 ENCOUNTER — Encounter (HOSPITAL_BASED_OUTPATIENT_CLINIC_OR_DEPARTMENT_OTHER): Payer: Self-pay

## 2023-07-13 ENCOUNTER — Ambulatory Visit
Admission: RE | Admit: 2023-07-13 | Discharge: 2023-07-13 | Disposition: A | Discharge status: Home / Self Care | Payer: Medicare Other | Source: Ambulatory Visit | Attending: Physician Assistant | Admitting: Physician Assistant

## 2023-07-13 ENCOUNTER — Other Ambulatory Visit: Payer: Self-pay

## 2023-07-13 DIAGNOSIS — N6452 Nipple discharge: Secondary | ICD-10-CM

## 2023-07-13 DIAGNOSIS — E785 Hyperlipidemia, unspecified: Secondary | ICD-10-CM

## 2023-07-13 DIAGNOSIS — N6012 Diffuse cystic mastopathy of left breast: Secondary | ICD-10-CM | POA: Diagnosis not present

## 2023-07-13 DIAGNOSIS — N6325 Unspecified lump in the left breast, overlapping quadrants: Secondary | ICD-10-CM | POA: Diagnosis not present

## 2023-07-13 DIAGNOSIS — N6320 Unspecified lump in the left breast, unspecified quadrant: Secondary | ICD-10-CM

## 2023-07-13 HISTORY — PX: BREAST BIOPSY: SHX20

## 2023-07-13 MED ORDER — ROSUVASTATIN CALCIUM 5 MG PO TABS: 10.0000 mg | ORAL_TABLET | Freq: Every day | ORAL | 3 refills | Status: DC

## 2023-07-13 NOTE — Telephone Encounter (Signed)
 [facility] is stating that pt's insurance only covers 1 tablet daily for medication rosuvastatin  5 mg tablet, pt takes 2 tablets daily. Pharmacy would like for medication to be prescribed as rosuvastatin  10 mg daily.  Please address

## 2023-07-14 LAB — SURGICAL PATHOLOGY

## 2023-07-15 ENCOUNTER — Other Ambulatory Visit: Payer: Self-pay | Admitting: Physician Assistant

## 2023-07-15 DIAGNOSIS — N6452 Nipple discharge: Secondary | ICD-10-CM

## 2023-07-17 DIAGNOSIS — Z13220 Encounter for screening for lipoid disorders: Secondary | ICD-10-CM | POA: Diagnosis not present

## 2023-07-17 DIAGNOSIS — Z0001 Encounter for general adult medical examination with abnormal findings: Secondary | ICD-10-CM | POA: Diagnosis not present

## 2023-07-17 DIAGNOSIS — Z0000 Encounter for general adult medical examination without abnormal findings: Secondary | ICD-10-CM | POA: Diagnosis not present

## 2023-07-18 LAB — LAB REPORT - SCANNED: EGFR: 90

## 2023-08-15 ENCOUNTER — Other Ambulatory Visit: Payer: Medicare Other

## 2023-08-28 ENCOUNTER — Ambulatory Visit (INDEPENDENT_AMBULATORY_CARE_PROVIDER_SITE_OTHER): Payer: Medicare Other | Admitting: Family

## 2023-08-28 ENCOUNTER — Encounter (HOSPITAL_BASED_OUTPATIENT_CLINIC_OR_DEPARTMENT_OTHER): Payer: Self-pay | Admitting: Family

## 2023-08-28 VITALS — BP 136/82 | HR 99 | Ht 64.0 in | Wt 244.1 lb

## 2023-08-28 DIAGNOSIS — I2510 Atherosclerotic heart disease of native coronary artery without angina pectoris: Secondary | ICD-10-CM | POA: Diagnosis not present

## 2023-08-28 DIAGNOSIS — I10 Essential (primary) hypertension: Secondary | ICD-10-CM | POA: Diagnosis not present

## 2023-08-28 DIAGNOSIS — E785 Hyperlipidemia, unspecified: Secondary | ICD-10-CM

## 2023-08-28 MED ORDER — ROSUVASTATIN CALCIUM 5 MG PO TABS: 5.0000 mg | ORAL_TABLET | Freq: Every day | ORAL | 3 refills | Status: AC

## 2023-08-28 MED ORDER — NEBIVOLOL HCL 2.5 MG PO TABS: 2.5000 mg | ORAL_TABLET | Freq: Every day | ORAL | 1 refills | Status: DC

## 2023-08-28 MED ORDER — NEBIVOLOL HCL 5 MG PO TABS
5.0000 mg | ORAL_TABLET | Freq: Every day | ORAL | 1 refills | Status: DC
Start: 2023-08-28 — End: 2024-01-01

## 2023-08-28 NOTE — Patient Instructions (Signed)
 Medication Instructions:   START Magnesium ~200mg  daily Magnesm glyinate (preferred), magnesium oxide, magnesium chloride (also good). Avoid magnesium citrate.   Resume Nebivolol 7.5mg  daily Take one 2.5mg  tablet with one 5mg  tablet once per day  Hold off on Metformin for now  *If you need a refill on your cardiac medications before your next appointment, please call your pharmacy*  Lab Work: Your physician recommends that you return for lab work in 7-10 days A1c, BMET, magnesium, renin-aldosterone  If you have labs (blood work) drawn today and your tests are completely normal, you will receive your results only by: MyChart Message (if you have MyChart) OR A paper copy in the mail If you have any lab test that is abnormal or we need to change your treatment, we will call you to review the results.  Follow-Up: At [facility], you and your health needs are our priority.  As part of our continuing mission to provide you with exceptional heart care, our providers are all part of one team.  This team includes your primary Cardiologist (physician) and Advanced Practice Providers or APPs (Physician Assistants and Nurse Practitioners) who all work together to provide you with the care you need, when you need it.  Your next appointment:   June or July 2025  Provider:   Jodelle Red, MD    We recommend signing up for the patient portal called "MyChart".  Sign up information is provided on this After Visit Summary.  MyChart is used to connect with patients for Virtual Visits (Telemedicine).  Patients are able to view lab/test results, encounter notes, upcoming appointments, etc.  Non-urgent messages can be sent to your provider as well.   To learn more about what you can do with MyChart, go to [email or web address].   Other Instructions   For coronary artery disease often called "heart disease" we aim for optimal guideline directed medical therapy. We use the "A, B, C"s to  help keep [place] on track!  A = Aspirin 81mg  daily B = Beta blocker which helps to relax the heart. This is your Nebivolol C = Cholesterol control. You take Rosuvastatin to help control your cholesterol.  D = Diet aiming for for low sodium, heart healthy diet E = Exercise - Recommend aiming for 150 minutes of moderate intensity activity per week ________________________  [facility] at [facility], NP [address], [place],  [place]  [address] Main: [phone]  ______________________________  To prevent or reduce lower extremity swelling: Eat a low salt diet. Salt makes the body hold onto extra fluid which causes swelling. Sit with legs elevated. For example, in the recliner or on an ottoman.  Wear knee-high compression stockings during the daytime. Ones labeled 15-20 mmHg provide good compression.  ______________________________  Let us know when you want a referral!

## 2023-08-28 NOTE — Progress Notes (Signed)
 Cardiology Office Note:  .   Date:  08/28/2023  ID:  Tamara Rosales, DOB 02-20-1956, MRN [id number] PCP: Tamara Rosales  [facility] Providers Cardiologist:  Jodelle Red, MD    History of Present Illness: .   Tamara Rosales is a 68 y.o. female with history of hypertension, HLD, PAC, palpitations. Family history notable for father with CAD and first MI 20 yo and CABG around age 28. Brother with PPM.   Established with Dr. Cristal Rosales 11/28/22 due to palpitations onset 09/2022. Prior TSH, renal function, CBC unremarkable.  She wore monitor with predominantly NSR and no high risk arrhythmias. 9 triggered events were all NSR.   Last seen 06/29/23 with two episodes of chest pain described as tightness or squeezing. Her palpitations were quiescent on OTC potassium. Subsequent cardiac CTA 07/23/23 with RCA focal noncalcified plaque in distal vessel 1-24% stenosis. Rosuvastatin was initiated.   Presents today for follow up. In the midst of home renovations for the next 4-6 weeks. Reviewed CTA, reassurance provided.  Tolerating rosuvastatin without issue.  Update lipid panel CCP 07/18/2023 to 100, TSH 184, HDL 27, LDL 41.  She notes increased palpitations.  She has been taking nebivolol 2.5 mg daily instead of her usual 7.5 mg dose as her PCP did not refill the 5 mg tablet.  She also notes persistent issues with low potassium and magnesium.  ROS: Please see the history of present illness.    All other systems reviewed and are negative.   Studies Reviewed: .           Risk Assessment/Calculations:             Physical Exam:   VS:  BP 136/82   Pulse 99   Ht 5\' 4"  (1.626 m)   Wt 244 lb 1.6 oz (110.7 kg)   SpO2 97%   BMI 41.90 kg/m    Wt Readings from Last 3 Encounters:  08/28/23 244 lb 1.6 oz (110.7 kg)  06/29/23 243 lb 11.2 oz (110.5 kg)  04/15/23 238 lb (108 kg)    GEN: Well nourished, well developed in no acute distress NECK: No JVD; No carotid  bruits CARDIAC: RRR, no murmurs, rubs, gallops RESPIRATORY:  Clear to auscultation without rales, wheezing or rhonchi  ABDOMEN: Soft, non-tender, non-distended EXTREMITIES:  No edema; No deformity   ASSESSMENT AND PLAN: .    Palpitations / Hypokalemia / Hypomagnesia - Recent increase in palpitations likely multifactorial electrolyte abnormalities and not having her full dose of nebivolol.  Rx nebivolol 7.5 mg daily as this dose was previously well-tolerated.  Start magnesium OTC 200 mg daily.  Lab work in 7 to 10 days BMET, magnesium, renin-aldo. If K/mag persistently low, plan to stop hydrochlorothiazide.   Obesity- Weight loss via diet and exercise encouraged. Discussed the impact being overweight would have on cardiovascular risk. Refer to PREP program. A1c with labs in 7-10 days. Previously on Metformin per Dr. Orson Slick of [facility]. Reassess cardiometabolic status at follow up with Dr. Cristal Rosales.   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 No anginal symptoms. Reviewed medical therapy for CAD. GDMT Aspirin 81mg  daily, Nebivolol 7.5mg  daily, Rosuvastatin 5mg  daily. Heart healthy diet and regular cardiovascular exercise encouraged.  Offered referral to PREP exercise program, she will contact us if interested.  HTN - BP mildly elevated in setting of taking Nebivolol 2.5mg  instead of prior  7.5mg  dose due to issues with refills. Increase Nebivolol to 7.5mg  daily.      Dispo: Follow-up June or July 2025 with Dr. Cristal Rosales  Signed, Alver Sorrow, NP

## 2023-08-30 ENCOUNTER — Ambulatory Visit
Admission: RE | Admit: 2023-08-30 | Discharge: 2023-08-30 | Disposition: A | Discharge status: Home / Self Care | Payer: Medicare Other | Source: Ambulatory Visit | Attending: Physician Assistant | Admitting: Physician Assistant

## 2023-08-30 DIAGNOSIS — N6452 Nipple discharge: Secondary | ICD-10-CM | POA: Diagnosis not present

## 2023-08-30 MED ORDER — GADOPICLENOL 0.5 MMOL/ML IV SOLN
10.0000 mL | Freq: Once | INTRAVENOUS | Status: AC | PRN
  Administered 2023-08-30: 10 mL via INTRAVENOUS

## 2023-09-07 DIAGNOSIS — R7303 Prediabetes: Secondary | ICD-10-CM | POA: Diagnosis not present

## 2023-09-07 DIAGNOSIS — I10 Essential (primary) hypertension: Secondary | ICD-10-CM | POA: Diagnosis not present

## 2023-09-07 DIAGNOSIS — Z131 Encounter for screening for diabetes mellitus: Secondary | ICD-10-CM | POA: Diagnosis not present

## 2023-09-07 DIAGNOSIS — R739 Hyperglycemia, unspecified: Secondary | ICD-10-CM | POA: Diagnosis not present

## 2023-09-07 DIAGNOSIS — E785 Hyperlipidemia, unspecified: Secondary | ICD-10-CM | POA: Diagnosis not present

## 2023-09-07 DIAGNOSIS — I2510 Atherosclerotic heart disease of native coronary artery without angina pectoris: Secondary | ICD-10-CM | POA: Diagnosis not present

## 2023-09-10 ENCOUNTER — Encounter (HOSPITAL_BASED_OUTPATIENT_CLINIC_OR_DEPARTMENT_OTHER): Payer: Self-pay

## 2023-09-10 DIAGNOSIS — R7303 Prediabetes: Secondary | ICD-10-CM

## 2023-09-10 LAB — ALDOSTERONE + RENIN ACTIVITY W/ RATIO
Aldos/Renin Ratio: 1.4 (ref 0.0–30.0)
Aldosterone: 7.5 ng/dL (ref 0.0–30.0)
Renin Activity, Plasma: 5.551 ng/mL/h — ABNORMAL HIGH (ref 0.167–5.380)

## 2023-09-10 LAB — HEMOGLOBIN A1C
Est. average glucose Bld gHb Est-mCnc: 126 mg/dL
Hgb A1c MFr Bld: 6 % — ABNORMAL HIGH (ref 4.8–5.6)

## 2023-09-10 LAB — BASIC METABOLIC PANEL WITH GFR
BUN/Creatinine Ratio: 26 (ref 12–28)
BUN: 19 mg/dL (ref 8–27)
CO2: 27 mmol/L (ref 20–29)
Calcium: 9.6 mg/dL (ref 8.7–10.3)
Chloride: 103 mmol/L (ref 96–106)
Creatinine, Ser: 0.74 mg/dL (ref 0.57–1.00)
Glucose: 78 mg/dL (ref 70–99)
Potassium: 3.8 mmol/L (ref 3.5–5.2)
Sodium: 146 mmol/L — ABNORMAL HIGH (ref 134–144)
eGFR: 88 mL/min/{1.73_m2} (ref >59)

## 2023-09-10 LAB — MAGNESIUM: Magnesium: 2.1 mg/dL (ref 1.6–2.3)

## 2023-09-14 MED ORDER — METFORMIN HCL ER 500 MG PO TB24: 500.0000 mg | ORAL_TABLET | Freq: Every day | ORAL | 3 refills | Status: AC

## 2023-09-14 NOTE — Telephone Encounter (Signed)
 Per Neomi Banks NP   "Metformin XR 500mg  daily for cardiometabolic syndrome, prediabetes."

## 2023-12-16 ENCOUNTER — Ambulatory Visit (HOSPITAL_BASED_OUTPATIENT_CLINIC_OR_DEPARTMENT_OTHER): Admitting: Cardiology

## 2023-12-16 ENCOUNTER — Encounter (HOSPITAL_BASED_OUTPATIENT_CLINIC_OR_DEPARTMENT_OTHER): Payer: Self-pay | Admitting: Cardiology

## 2023-12-16 VITALS — BP 140/80 | HR 72 | Ht 64.0 in | Wt 246.0 lb

## 2023-12-16 DIAGNOSIS — R002 Palpitations: Secondary | ICD-10-CM | POA: Diagnosis not present

## 2023-12-16 DIAGNOSIS — E876 Hypokalemia: Secondary | ICD-10-CM

## 2023-12-16 DIAGNOSIS — Z09 Encounter for follow-up examination after completed treatment for conditions other than malignant neoplasm: Secondary | ICD-10-CM

## 2023-12-16 DIAGNOSIS — I491 Atrial premature depolarization: Secondary | ICD-10-CM

## 2023-12-16 DIAGNOSIS — I10 Essential (primary) hypertension: Secondary | ICD-10-CM

## 2023-12-16 DIAGNOSIS — I2510 Atherosclerotic heart disease of native coronary artery without angina pectoris: Secondary | ICD-10-CM

## 2023-12-16 DIAGNOSIS — E782 Mixed hyperlipidemia: Secondary | ICD-10-CM

## 2023-12-16 DIAGNOSIS — Z7189 Other specified counseling: Secondary | ICD-10-CM

## 2023-12-16 NOTE — Patient Instructions (Signed)
 Medication Instructions:  Your physician recommends that you continue on your current medications as directed. Please refer to the Current Medication list given to you today.  *If you need a refill on your cardiac medications before your next appointment, please call your pharmacy*  Lab Work: LABS TODAY   Testing/Procedures: NONE  Follow-Up: At [facility], you and your health needs are our priority.  As part of our continuing mission to provide you with exceptional heart care, we have created designated Provider Care Teams.  These Care Teams include your primary Cardiologist (physician) and Advanced Practice Providers (APPs -  Physician Assistants and Nurse Practitioners) who all work together to provide you with the care you need, when you need it.  We recommend signing up for the patient portal called MyChart.  Sign up information is provided on this After Visit Summary.  MyChart is used to connect with patients for Virtual Visits (Telemedicine).  Patients are able to view lab/test results, encounter notes, upcoming appointments, etc.  Non-urgent messages can be sent to your provider as well.   To learn more about what you can do with MyChart, go to [email or web address].    Your next appointment:   6 month(s)  The format for your next appointment:   In Person  Provider:   Reche ORN NP

## 2023-12-16 NOTE — Progress Notes (Signed)
 Cardiology Office Note:  .   Date:  12/16/2023  ID:  Tamara Rosales, DOB 1956/02/26, MRN [id number] PCP: Tamara Rosales  [facility] Providers Cardiologist:  Shelda Bruckner, MD {  History of Present Illness: .   Tamara Rosales is a 68 y.o. female with PMH hypertension, hyperlipidemia, palpitations who is seen as a new patient for palpitations.  Cardiac history: Palpitations started 09/2022. Monitor predominantly NSR and no high risk arrhythmias. 9 triggered events were all NSR. Seen 06/2023 for chest pain, coronary CT with RCA focal noncalcified plaque in distal vessel 1-24% stenosis. Rosuvastatin  was initiated .  Family history:  father had Mis, first age 31, had bypass surgery around age 51. Brother has a pacemaker.  Today: Was in the ER recently at [facility] 7/11 for shortness of breath. Tried to manage with her albuterol  at but did not improve. [place] the beach with [place] last [place]. Was walking for a while, had to stop due to shortness of breath. Could hear herself wheezing. When it didn't improve, they called EMS and went to ER. Was told her ECG was abnormal. I cannot see ECG from ER, but per report was ok. Had some palpitations while in the ER but no chest pain. Was hypokalemic to 3.0, had potassium repleted. Had breathing treatment in the ambulance, she does not think she received steroids. Has not had any similar symptoms since. She decline prednisone.  Had an episode when her heart felt like turned over in her chest. This was more significant than her usual palpitations.   Feels palpitations a few times a day, 2-3 seconds at a time. No clear triggers. Has not had any more chest pain.   ROS: Denies chest pain, other shortness of breath at rest or with normal exertion. No PND, orthopnea, worsening LE edema or unexpected weight gain. No syncope. ROS otherwise negative except as noted.   Studies Reviewed: SABRA    EKG:     not ordered today  Physical  Exam:   VS:  BP (!) 140/80 (BP Location: Left Arm, Patient Position: Sitting, Cuff Size: Normal)   Pulse 72   Ht 5' 4 (1.626 m)   Wt 246 lb (111.6 kg)   SpO2 97%   BMI 42.23 kg/m    Wt Readings from Last 3 Encounters:  12/16/23 246 lb (111.6 kg)  08/28/23 244 lb 1.6 oz (110.7 kg)  06/29/23 243 lb 11.2 oz (110.5 kg)    GEN: Well nourished, well developed in no acute distress HEENT: Normal, moist mucous membranes NECK: No JVD CARDIAC: regular rhythm, normal S1 and S2, no rubs or gallops. No murmur. VASCULAR: Radial and DP pulses 2+ bilaterally. No carotid bruits RESPIRATORY:  Clear to auscultation without rales, wheezing or rhonchi  ABDOMEN: Soft, non-tender, non-distended MUSCULOSKELETAL:  Ambulates independently SKIN: Warm and dry, no edema NEUROLOGIC:  Alert and oriented x 3. No focal neuro deficits noted. PSYCHIATRIC:  Normal affect    ASSESSMENT AND PLAN: .   Palpitations PACs -with history of hypokalemia and hypomagnesemia -monitor unremarkable -takes OTC magnesium -recheck K, Mg given recent abnormalities -if K low, we discussed either supplementing or more likely changing from hydrochlorothiazide  to spironolactone -on nebivolol  7.5 mg daily  Hypertension -elevated today -continue lisinopril -hydrochlorothiazide , nebivolol  with caveats as noted above  Obesity -BMI 42 -on metformin , follows with healthy weight and wellness  Nonobstructive CAD Family history of cardiovascular disease Hyperlipidemia -continue aspirin, rosuvastatin  -had a protein drink today, will check lipids, if TG  very elevated would recheck fasting  CV risk counseling and prevention -recommend heart healthy/Mediterranean diet, with whole grains, fruits, vegetable, fish, lean meats, nuts, and olive oil. Limit salt. -recommend moderate walking, 3-5 times/week for 30-50 minutes each session. Aim for at least 150 minutes.week. Goal should be pace of 3 miles/hours, or walking 1.5 miles in 30  minutes -recommend avoidance of tobacco products. Avoid excess alcohol. -ASCVD risk score: The ASCVD Risk score (Arnett DK, et al., 2019) failed to calculate for the following reasons:   The valid total cholesterol range is 130 to 320 mg/dL   Dispo: 6 mos or sooner as needed  Signed, Shelda Bruckner, MD   Shelda Bruckner, MD, PhD, [facility]  [facility]  [place]  [facility] & Vascular at [facility] at [facility] [address], [address] [place], [place] [address] [phone]

## 2023-12-17 ENCOUNTER — Ambulatory Visit (HOSPITAL_BASED_OUTPATIENT_CLINIC_OR_DEPARTMENT_OTHER): Payer: Self-pay | Admitting: Cardiology

## 2023-12-17 LAB — LIPID PANEL
Chol/HDL Ratio: 3.3 ratio (ref 0.0–4.4)
Cholesterol, Total: 120 mg/dL (ref 100–199)
HDL: 36 mg/dL — ABNORMAL LOW (ref >39)
LDL Chol Calc (NIH): 54 mg/dL (ref 0–99)
Triglycerides: 180 mg/dL — ABNORMAL HIGH (ref 0–149)
VLDL Cholesterol Cal: 30 mg/dL (ref 5–40)

## 2023-12-17 LAB — MAGNESIUM: Magnesium: 1.9 mg/dL (ref 1.6–2.3)

## 2023-12-17 LAB — BASIC METABOLIC PANEL WITH GFR
BUN/Creatinine Ratio: 28 (ref 12–28)
BUN: 21 mg/dL (ref 8–27)
CO2: 26 mmol/L (ref 20–29)
Calcium: 9.7 mg/dL (ref 8.7–10.3)
Chloride: 100 mmol/L (ref 96–106)
Creatinine, Ser: 0.76 mg/dL (ref 0.57–1.00)
Glucose: 85 mg/dL (ref 70–99)
Potassium: 4.2 mmol/L (ref 3.5–5.2)
Sodium: 142 mmol/L (ref 134–144)
eGFR: 85 mL/min/1.73 (ref >59)

## 2023-12-21 ENCOUNTER — Encounter (HOSPITAL_BASED_OUTPATIENT_CLINIC_OR_DEPARTMENT_OTHER): Payer: Self-pay

## 2023-12-21 DIAGNOSIS — I10 Essential (primary) hypertension: Secondary | ICD-10-CM

## 2023-12-21 DIAGNOSIS — Z5181 Encounter for therapeutic drug level monitoring: Secondary | ICD-10-CM

## 2023-12-21 MED ORDER — LISINOPRIL 20 MG PO TABS: 20.0000 mg | ORAL_TABLET | Freq: Every day | ORAL | 1 refills | Status: DC

## 2023-12-21 MED ORDER — SPIRONOLACTONE 25 MG PO TABS: 12.5000 mg | ORAL_TABLET | Freq: Every day | ORAL | 1 refills | Status: DC

## 2023-12-21 NOTE — Telephone Encounter (Signed)
 stop lisinopril -htctz rx lisinopril  20mg  daily rx spironolactone  12.5mg  daily bmet in 7-10 days for monitoring  Reche GORMAN Finder, NP

## 2023-12-25 NOTE — Telephone Encounter (Signed)
Addressed via separate encounter.   Alver Sorrow, NP

## 2023-12-30 ENCOUNTER — Other Ambulatory Visit (HOSPITAL_BASED_OUTPATIENT_CLINIC_OR_DEPARTMENT_OTHER): Payer: Self-pay | Admitting: Family

## 2024-01-01 NOTE — Telephone Encounter (Signed)
 For review-ok to fill

## 2024-01-01 NOTE — Telephone Encounter (Signed)
 Herminia will need to go through primary care. I've never prescribed it for her so not sure why it sent request our way.   Jennye Runquist S Xoey Warmoth, NP

## 2024-01-06 ENCOUNTER — Ambulatory Visit (HOSPITAL_BASED_OUTPATIENT_CLINIC_OR_DEPARTMENT_OTHER): Payer: Self-pay | Admitting: Family

## 2024-01-06 LAB — BASIC METABOLIC PANEL WITH GFR
BUN/Creatinine Ratio: 26 (ref 12–28)
BUN: 20 mg/dL (ref 8–27)
CO2: 24 mmol/L (ref 20–29)
Calcium: 9.4 mg/dL (ref 8.7–10.3)
Chloride: 103 mmol/L (ref 96–106)
Creatinine, Ser: 0.77 mg/dL (ref 0.57–1.00)
Glucose: 100 mg/dL — ABNORMAL HIGH (ref 70–99)
Potassium: 3.7 mmol/L (ref 3.5–5.2)
Sodium: 142 mmol/L (ref 134–144)
eGFR: 84 mL/min/1.73 (ref >59)

## 2024-02-10 ENCOUNTER — Encounter (HOSPITAL_BASED_OUTPATIENT_CLINIC_OR_DEPARTMENT_OTHER): Payer: Self-pay

## 2024-02-10 DIAGNOSIS — Z5181 Encounter for therapeutic drug level monitoring: Secondary | ICD-10-CM

## 2024-02-10 DIAGNOSIS — I10 Essential (primary) hypertension: Secondary | ICD-10-CM

## 2024-02-10 DIAGNOSIS — R600 Localized edema: Secondary | ICD-10-CM

## 2024-02-11 NOTE — Telephone Encounter (Signed)
 If she has not had labs since being on Spironolactone  25mg  dose, needs BMET/BNP. I'm not totally clear who told her to increase the dose. Okay to refill Spironolactone  25 mg daily. Tips/tricks for swelling below. If her BNP is abnormal, consider echo. If normal, would follow up with PCP regarding swelling.   To prevent or reduce lower extremity swelling: Eat a low salt diet. Salt makes the body hold onto extra fluid which causes swelling. Sit with legs elevated. For example, in the recliner or on an ottoman.  Wear knee-high compression stockings during the daytime. Ones labeled 15-20 mmHg provide good compression.   Tamara Peth S Kadian Barcellos, NP

## 2024-02-14 ENCOUNTER — Ambulatory Visit (HOSPITAL_BASED_OUTPATIENT_CLINIC_OR_DEPARTMENT_OTHER): Payer: Self-pay | Admitting: Family

## 2024-02-14 LAB — BASIC METABOLIC PANEL WITH GFR
BUN/Creatinine Ratio: 23 (ref 12–28)
BUN: 15 mg/dL (ref 8–27)
CO2: 24 mmol/L (ref 20–29)
Calcium: 9.6 mg/dL (ref 8.7–10.3)
Chloride: 104 mmol/L (ref 96–106)
Creatinine, Ser: 0.66 mg/dL (ref 0.57–1.00)
Glucose: 130 mg/dL — ABNORMAL HIGH (ref 70–99)
Potassium: 4.2 mmol/L (ref 3.5–5.2)
Sodium: 142 mmol/L (ref 134–144)
eGFR: 95 mL/min/1.73 (ref >59)

## 2024-02-14 LAB — BRAIN NATRIURETIC PEPTIDE: BNP: 31.9 pg/mL (ref 0.0–100.0)

## 2024-02-15 MED ORDER — SPIRONOLACTONE 25 MG PO TABS: 25.0000 mg | ORAL_TABLET | Freq: Every day | ORAL | 3 refills | Status: AC

## 2024-03-26 ENCOUNTER — Other Ambulatory Visit (HOSPITAL_BASED_OUTPATIENT_CLINIC_OR_DEPARTMENT_OTHER): Payer: Self-pay | Admitting: Family

## 2024-03-26 DIAGNOSIS — I10 Essential (primary) hypertension: Secondary | ICD-10-CM

## 2024-03-30 ENCOUNTER — Other Ambulatory Visit: Payer: Self-pay | Admitting: Physician Assistant

## 2024-03-30 DIAGNOSIS — N6452 Nipple discharge: Secondary | ICD-10-CM

## 2024-03-30 DIAGNOSIS — N644 Mastodynia: Secondary | ICD-10-CM

## 2024-04-02 ENCOUNTER — Ambulatory Visit
Admission: RE | Admit: 2024-04-02 | Discharge: 2024-04-02 | Disposition: A | Source: Ambulatory Visit | Attending: Physician Assistant | Admitting: Physician Assistant

## 2024-04-02 DIAGNOSIS — N6452 Nipple discharge: Secondary | ICD-10-CM

## 2024-04-02 DIAGNOSIS — N644 Mastodynia: Secondary | ICD-10-CM

## 2024-04-02 MED ORDER — GADOPICLENOL 0.5 MMOL/ML IV SOLN
10.0000 mL | Freq: Once | INTRAVENOUS | Status: AC | PRN
  Administered 2024-04-02: 10 mL via INTRAVENOUS

## 2024-04-05 ENCOUNTER — Other Ambulatory Visit: Payer: Self-pay | Admitting: Physician Assistant

## 2024-04-05 DIAGNOSIS — R2232 Localized swelling, mass and lump, left upper limb: Secondary | ICD-10-CM

## 2024-04-08 ENCOUNTER — Other Ambulatory Visit

## 2024-04-12 ENCOUNTER — Ambulatory Visit
Admission: RE | Admit: 2024-04-12 | Discharge: 2024-04-12 | Disposition: A | Discharge status: Home / Self Care | Source: Ambulatory Visit | Attending: Physician Assistant | Admitting: Physician Assistant

## 2024-04-12 ENCOUNTER — Ambulatory Visit
Admission: RE | Admit: 2024-04-12 | Discharge: 2024-04-12 | Disposition: A | Source: Ambulatory Visit | Attending: Physician Assistant | Admitting: Physician Assistant

## 2024-04-12 DIAGNOSIS — R2232 Localized swelling, mass and lump, left upper limb: Secondary | ICD-10-CM

## 2024-05-31 ENCOUNTER — Ambulatory Visit (HOSPITAL_BASED_OUTPATIENT_CLINIC_OR_DEPARTMENT_OTHER): Admitting: Family

## 2024-06-02 NOTE — Progress Notes (Unsigned)
 " Cardiology Office Note   Date: 06/03/2024  ID:  Tamara Rosales [facility] 1956/04/21 [id number] PCP: Nena Rosina LITTIE DEVONNA   [facility] Providers Cardiologist: Shelda Bruckner, MD     Chief Complaint: Tamara Rosales is a 69 y.o.female with PMH of minimal nonobstructive CAD on coronary CT 07/08/2023, hypertension, hyperlipidemia, palpitations, prediabetes, hypothyroidism who presents to the clinic for 75-month follow-up.    Tamara Rosales was initially seen by Dr. Bruckner 11/2022 for evaluation of palpitations.  She had been seen in the emergency room in April and frequent PACs were noted on telemetry.  14-day Rosales showed predominantly NSR with average HR of 66 bpm.  Less than 1% atrial and ventricular ectopy, triggered events correlated with NSR.    During visit 06/2023, palpitations had improved with the use of OTC potassium.  However she did note a couple of episodes of chest pain concerning for possible angina.  Given this and strong family history of CAD, coronary CTA was ordered and rosuvastatin  was initiated.  This was completed 07/08/2023 and showed coronary artery calcium  score of 0, minimal nonobstructive CAD to the distal RCA and LCx.  Office visit 08/2023 she had increased palpitations, though had been taking lower dose of beta blocker.  Also noted persistent issues with electrolytes.  Nebivolol  increased, electrolytes found to be normal, A1c slightly elevated and metformin  was started for cardiometabolic syndrome.  Was seen in the ER at [facility] 12/2023 for dyspnea and palpitations.  Unable to view EKG but was reportedly okay.  Potassium noted to be 3 and this was repleted.  Seen in follow-up 12/16/2023 by Dr. Bruckner with continued palpitations.  Electrolytes were okay but she ultimately decided to stop hydrochlorothiazide  and switch to spironolactone  12.5 mg daily. She contacted the office a couple of weeks later with increased swelling and opted to increase spironolactone   to 25 mg daily.    History of Present Illness: Today she is doing well. She does still have occasional palpitations at rest but they have improved in frequency and self-resolve. She is unsure if she is still taking nebivolol . Her BP is 134/92 today and 120s-130s/80s-90s at home. She has started walking at the [facility] with her friends and denies dyspnea or chest discomfort with this activity. She experiences LE edema that is worse in the evenings and is improved in the morning. She does note that the edema has improved since starting spironolactone  as well. She is looking forward to traveling with her family to [place]  for her granddaughter's high school graduation this summer.  ROS: Denies chest pain, shortness of breath, orthopnea, PND, lightheadedness, dizziness, syncope.  Studies Reviewed: The following studies were reviewed today: EKG Interpretation Date/Time:  Friday June 03 2024 10:42:58 EST Ventricular Rate:  85 PR Interval:  196 QRS Duration:  112 QT Interval:  370 QTC Calculation: 440 R Axis:   -61  Text Interpretation: Normal sinus rhythm Confirmed by Nola Numbers ([id number]) on 06/03/2024 10:46:13 AM   Cardiac Studies & Procedures   ______________________________________________________________________________________________        Tamara  LONG TERM Rosales (3-14 DAYS) 12/22/2022  Narrative Patch Wear Time:  13 days and 23 hours (2024-06-28T10:42:10-398 to 2024-07-12T10:35:03-399)  Patient had a min HR of 39 bpm, max HR of 128 bpm, and avg HR of 66 bpm. Predominant underlying rhythm was Sinus Rhythm. No VT, SVT, atrial fibrillation, high degree block, or pauses noted. Isolated atrial and ventricular ectopy was rare (<1%). There were 9 triggered events, which were sinus. No high risk arrhythmias detected.  CT SCANS  CT CORONARY MORPH W/CTA COR W/SCORE 07/08/2023  Addendum 07/23/2023  6:35 PM ADDENDUM REPORT: 07/23/2023 18:33  EXAM: OVER-READ INTERPRETATION  CT  CHEST  The following report is an over-read performed by radiologist Dr. Suzen Dials of [facility] on 07/23/2023. This over-read does not include interpretation of cardiac or coronary anatomy or pathology. The coronary calcium  score/coronary CTA interpretation by the cardiologist is attached.  COMPARISON:  None.  FINDINGS: Cardiovascular: There are no significant extracardiac vascular findings.  Mediastinum/Nodes: There are no enlarged lymph nodes within the visualized mediastinum.  Lungs/Pleura: There is no pleural effusion. The visualized lungs appear clear.  Upper abdomen: There is diffuse fatty infiltration of the liver parenchyma.  Musculoskeletal/Chest wall: No chest wall mass or suspicious osseous findings within the visualized chest.  IMPRESSION: No significant extracardiac findings within the visualized chest.   Electronically Signed By: Suzen Dials M.D. On: 07/23/2023 18:33  Narrative HISTORY: Chest pain/anginal equiv, ECGs and troponins normal Dyspnea on exertion (DOE)  EXAM: Cardiac/Coronary CT  TECHNIQUE: The patient was scanned on a [employer].  PROTOCOL: A 110 kV prospective scan was triggered in the descending thoracic aorta at 111 HU's. Axial non-contrast 3 mm slices were carried out through the heart. The data set was analyzed on a dedicated work station and scored using the Agatston method. Gantry rotation speed was 250 msecs and collimation was 0.6 mm. Heart rate was optimized medically and sl NTG was given. The 3D data set was reconstructed in 5% intervals of the 35-75 % of the R-R cycle. Systolic and diastolic phases were analyzed on a dedicated work station using MPR, MIP and VRT modes. The patient received 95mL OMNIPAQUE  IOHEXOL  350 MG/ML SOLN of contrast.  FINDINGS: Coronary calcium  score: The patient's coronary artery calcium  score is 0, which places the patient in the 0 percentile.  Coronary  arteries: Normal coronary origins.  Right dominance.  Right Coronary Artery: Normal caliber vessel, gives rise to PDA. Focal noncalcified plaque in distal vessel with 1-24% stenosis.  Left Main Coronary Artery: Normal caliber vessel. No significant plaque or stenosis.  Left Anterior Descending Coronary Artery: Normal caliber vessel. No significant plaque or stenosis. Gives rise to small first, large second diagonal branches. Distal LAD wraps apex.  Left Circumflex Artery: Normal caliber vessel. Focal noncalcified plaque in distal vessel with 1-24% stenosis. Gives rise to 1 OM branches. There is a twist/loop in the mid portion without evidence of stenosis.  Aorta: Normal size, 37 mm at the mid ascending aorta (level of the PA bifurcation) measured double oblique. No aortic atherosclerosis. No dissection seen in visualized portions of the aorta.  Aortic Valve: No calcifications. Trileaflet.  Other findings:  Normal pulmonary vein drainage into the left atrium.  Normal left atrial appendage without a thrombus.  Normal size of the pulmonary artery.  Normal appearance of the pericardium.  Signal to noise artifact present.  IMPRESSION: 1.  Minimal nonobstructive CAD, CADRADS = 1.  2. Coronary calcium  score of 0. This was 0 percentile for age-, sex-, and race- matched controls.  3. Total plaque volume 51 mm3 which is 25th percentile for age- and sex- matched controls (calcified plaque 0 mm3; noncalcified plaque 51 mm3). Total plaque volume is mild.  4. Normal coronary origin with right dominance.  INTERPRETATION:  CAD-RADS 1: Minimal non-obstructive CAD (1-24%). Consider non-atherosclerotic causes of chest pain. Consider preventive therapy and risk factor modification.  Electronically Signed: By: Shelda Bruckner M.D. On: 07/09/2023 17:38  ______________________________________________________________________________________________               Physical Exam: VS: BP (!) 134/92   Pulse 89   Ht 5' 4 (1.626 m)   Wt 241 lb 12.8 oz (109.7 kg)   SpO2 97%   BMI 41.50 kg/m   GEN: Well nourished, in NAD HEENT: Normal NECK: No JVD CARDIAC: RRR, no murmurs, rubs, gallops RESPIRATORY: Clear to auscultation bilaterally ABDOMEN: Soft, non-tender, non-distended MUSCULOSKELETAL: No edema, no deformity  SKIN: Warm and dry NEUROLOGIC:  Alert and oriented x 3 PSYCHIATRIC:  Pleasant, normal affect   Assessment & Plan: 1. Hypertension: BP 134/92 today, similar to her home readings. She is unsure if she has been taking nebivolol . She is going to go home this afternon and look at her medications and notify the office of what she is taking. - Continue lisinopril  20 mg daily - Continue spironolactone  25 mg daily - Restart nebivolol  7.5 mg daily if she is not taking it - consider increasing to 10 mg daily if she is taking it  2. Palpitations/Hx of hypokalemia: EKG today NSR 85 bpm.  02/12/2024 SCr 0.66, K 4.2 after increasing spironolactone  from 12.5 to 25 mg daily. She does still have occasional palpitations, but they have improved in frequency and they do self-resolve.  - Continue spironolactone  25 mg daily - Nebivolol  as above  3. Minimal nonobstructive CAD: Noted on coronary CTA 07/08/2023.  EKG today without acute ischemic changes.  Denies anginal symptoms. - Continue aspirin 81 mg daily - Continue rosuvastatin  5 mg daily  4. Hyperlipidemia: 12/16/2023 LDL 54, HDL 36, TGs 180, total 120. 12/10/2023 AST 21, ALT 25. TGs were elevated on this check, but decreasing. - Continue rosuvastatin  5 mg daily - Recommend lifestyle modifications with high-fiber, low-fat diet and regular physical exercise as tolerated    Dispo: Follow-up in 6 months with Dr. Lonni or Reche Finder, NP.  Signed, Saddie GORMAN Cleaves, NP 06/03/2024 11:24 AM [facility] "

## 2024-06-03 ENCOUNTER — Encounter (HOSPITAL_BASED_OUTPATIENT_CLINIC_OR_DEPARTMENT_OTHER): Payer: Self-pay

## 2024-06-03 ENCOUNTER — Ambulatory Visit (HOSPITAL_BASED_OUTPATIENT_CLINIC_OR_DEPARTMENT_OTHER)

## 2024-06-03 ENCOUNTER — Other Ambulatory Visit (HOSPITAL_BASED_OUTPATIENT_CLINIC_OR_DEPARTMENT_OTHER): Payer: Self-pay

## 2024-06-03 VITALS — BP 134/92 | HR 89 | Ht 64.0 in | Wt 241.8 lb

## 2024-06-03 DIAGNOSIS — R002 Palpitations: Secondary | ICD-10-CM

## 2024-06-03 DIAGNOSIS — I2510 Atherosclerotic heart disease of native coronary artery without angina pectoris: Secondary | ICD-10-CM

## 2024-06-03 DIAGNOSIS — E782 Mixed hyperlipidemia: Secondary | ICD-10-CM | POA: Diagnosis not present

## 2024-06-03 DIAGNOSIS — I10 Essential (primary) hypertension: Secondary | ICD-10-CM

## 2024-06-03 MED ORDER — FLUZONE HIGH-DOSE 0.5 ML IM SUSY
0.5000 mL | PREFILLED_SYRINGE | Freq: Once | INTRAMUSCULAR | 0 refills | Status: AC
  Filled 2024-06-03: qty 0.5, 1d supply, fill #0

## 2024-06-03 NOTE — Patient Instructions (Signed)
 Medication Instructions:   Your physician recommends that you continue on your current medications as directed. Please refer to the Current Medication list given to you today.  *If you need a refill on your cardiac medications before your next appointment, please call your pharmacy*    Follow-Up: At [facility], you and your health needs are our priority.  As part of our continuing mission to provide you with exceptional heart care, our providers are all part of one team.  This team includes your primary Cardiologist (physician) and Advanced Practice Providers or APPs (Physician Assistants and Nurse Practitioners) who all work together to provide you with the care you need, when you need it.  Your next appointment:   6 month(s)  Provider:   Shelda Bruckner, MD, Rosaline Bane, NP, or Reche Finder, NP

## 2024-06-07 ENCOUNTER — Encounter (HOSPITAL_BASED_OUTPATIENT_CLINIC_OR_DEPARTMENT_OTHER): Payer: Self-pay

## 2024-06-17 ENCOUNTER — Other Ambulatory Visit: Payer: Self-pay | Admitting: Physician Assistant

## 2024-06-17 DIAGNOSIS — Z1231 Encounter for screening mammogram for malignant neoplasm of breast: Secondary | ICD-10-CM

## 2024-06-23 ENCOUNTER — Other Ambulatory Visit (HOSPITAL_BASED_OUTPATIENT_CLINIC_OR_DEPARTMENT_OTHER): Payer: Self-pay | Admitting: Family

## 2024-06-23 DIAGNOSIS — I10 Essential (primary) hypertension: Secondary | ICD-10-CM

## 2024-06-29 ENCOUNTER — Other Ambulatory Visit: Payer: Self-pay | Admitting: Family

## 2024-06-29 ENCOUNTER — Other Ambulatory Visit (HOSPITAL_BASED_OUTPATIENT_CLINIC_OR_DEPARTMENT_OTHER): Payer: Self-pay | Admitting: *Deleted

## 2024-06-29 DIAGNOSIS — I10 Essential (primary) hypertension: Secondary | ICD-10-CM

## 2024-06-29 MED ORDER — NEBIVOLOL HCL 2.5 MG PO TABS: 2.5000 mg | ORAL_TABLET | Freq: Every day | ORAL | 3 refills | Status: AC

## 2024-06-29 MED ORDER — LISINOPRIL 20 MG PO TABS: 20.0000 mg | ORAL_TABLET | Freq: Every day | ORAL | 2 refills | Status: AC

## 2024-07-08 ENCOUNTER — Ambulatory Visit: Admission: RE | Admit: 2024-07-08 | Source: Ambulatory Visit

## 2024-07-08 DIAGNOSIS — Z1231 Encounter for screening mammogram for malignant neoplasm of breast: Secondary | ICD-10-CM
